# Patient Record
Sex: Female | Born: 1945 | Race: Black or African American | Hispanic: No | Marital: Married | Smoking: Never smoker
Health system: Southern US, Community
[De-identification: ages and names within clinical notes are randomized; demographics above are authoritative.]

---

## 1999-01-20 ENCOUNTER — Other Ambulatory Visit: Admission: RE | Admit: 1999-01-20 | Discharge: 1999-01-20 | Payer: Self-pay | Admitting: *Deleted

## 2000-08-17 ENCOUNTER — Encounter: Payer: Self-pay | Admitting: Family Medicine

## 2000-08-17 ENCOUNTER — Ambulatory Visit (HOSPITAL_COMMUNITY): Admission: RE | Admit: 2000-08-17 | Discharge: 2000-08-17 | Payer: Self-pay | Admitting: Family Medicine

## 2001-05-05 ENCOUNTER — Ambulatory Visit (HOSPITAL_COMMUNITY): Admission: RE | Admit: 2001-05-05 | Discharge: 2001-05-05 | Payer: Self-pay | Admitting: Family Medicine

## 2001-05-05 ENCOUNTER — Encounter: Payer: Self-pay | Admitting: Family Medicine

## 2002-06-01 ENCOUNTER — Other Ambulatory Visit: Admission: RE | Admit: 2002-06-01 | Discharge: 2002-06-01 | Payer: Self-pay | Admitting: Family Medicine

## 2002-06-02 ENCOUNTER — Encounter: Payer: Self-pay | Admitting: Family Medicine

## 2002-06-02 ENCOUNTER — Ambulatory Visit (HOSPITAL_COMMUNITY): Admission: RE | Admit: 2002-06-02 | Discharge: 2002-06-02 | Payer: Self-pay | Admitting: Family Medicine

## 2002-08-23 ENCOUNTER — Inpatient Hospital Stay (HOSPITAL_COMMUNITY): Admission: AD | Admit: 2002-08-23 | Discharge: 2002-08-23 | Payer: Self-pay | Admitting: Obstetrics and Gynecology

## 2002-08-31 ENCOUNTER — Encounter: Payer: Self-pay | Admitting: Emergency Medicine

## 2002-08-31 ENCOUNTER — Emergency Department (HOSPITAL_COMMUNITY): Admission: EM | Admit: 2002-08-31 | Discharge: 2002-08-31 | Payer: Self-pay | Admitting: Emergency Medicine

## 2003-02-18 ENCOUNTER — Encounter: Payer: Self-pay | Admitting: Emergency Medicine

## 2003-02-18 ENCOUNTER — Emergency Department (HOSPITAL_COMMUNITY): Admission: EM | Admit: 2003-02-18 | Discharge: 2003-02-18 | Payer: Self-pay | Admitting: Emergency Medicine

## 2003-03-01 ENCOUNTER — Ambulatory Visit (HOSPITAL_BASED_OUTPATIENT_CLINIC_OR_DEPARTMENT_OTHER): Admission: RE | Admit: 2003-03-01 | Discharge: 2003-03-01 | Payer: Self-pay | Admitting: Urology

## 2003-06-06 ENCOUNTER — Encounter: Payer: Self-pay | Admitting: Family Medicine

## 2003-06-06 ENCOUNTER — Ambulatory Visit (HOSPITAL_COMMUNITY): Admission: RE | Admit: 2003-06-06 | Discharge: 2003-06-06 | Payer: Self-pay | Admitting: Family Medicine

## 2004-06-30 ENCOUNTER — Ambulatory Visit (HOSPITAL_COMMUNITY): Admission: RE | Admit: 2004-06-30 | Discharge: 2004-06-30 | Payer: Self-pay | Admitting: Family Medicine

## 2005-08-20 ENCOUNTER — Ambulatory Visit (HOSPITAL_COMMUNITY): Admission: RE | Admit: 2005-08-20 | Discharge: 2005-08-20 | Payer: Self-pay | Admitting: Internal Medicine

## 2005-08-21 ENCOUNTER — Encounter (INDEPENDENT_AMBULATORY_CARE_PROVIDER_SITE_OTHER): Payer: Self-pay | Admitting: Specialist

## 2005-08-21 ENCOUNTER — Ambulatory Visit (HOSPITAL_COMMUNITY): Admission: RE | Admit: 2005-08-21 | Discharge: 2005-08-21 | Payer: Self-pay | Admitting: *Deleted

## 2005-12-11 ENCOUNTER — Ambulatory Visit (HOSPITAL_COMMUNITY): Admission: RE | Admit: 2005-12-11 | Discharge: 2005-12-11 | Payer: Self-pay | Admitting: *Deleted

## 2006-09-02 ENCOUNTER — Ambulatory Visit (HOSPITAL_COMMUNITY): Admission: RE | Admit: 2006-09-02 | Discharge: 2006-09-02 | Payer: Self-pay | Admitting: Internal Medicine

## 2007-06-30 ENCOUNTER — Ambulatory Visit (HOSPITAL_COMMUNITY): Admission: RE | Admit: 2007-06-30 | Discharge: 2007-06-30 | Payer: Self-pay | Admitting: *Deleted

## 2007-07-07 ENCOUNTER — Encounter: Admission: RE | Admit: 2007-07-07 | Discharge: 2007-07-12 | Payer: Self-pay | Admitting: Otolaryngology

## 2007-10-13 ENCOUNTER — Ambulatory Visit (HOSPITAL_COMMUNITY): Admission: RE | Admit: 2007-10-13 | Discharge: 2007-10-13 | Payer: Self-pay | Admitting: Internal Medicine

## 2008-12-06 ENCOUNTER — Encounter: Admission: RE | Admit: 2008-12-06 | Discharge: 2008-12-06 | Payer: Self-pay | Admitting: Internal Medicine

## 2010-01-16 ENCOUNTER — Encounter: Admission: RE | Admit: 2010-01-16 | Discharge: 2010-01-16 | Payer: Self-pay | Admitting: Internal Medicine

## 2010-10-25 ENCOUNTER — Encounter: Payer: Self-pay | Admitting: Internal Medicine

## 2010-10-26 ENCOUNTER — Encounter: Payer: Self-pay | Admitting: Internal Medicine

## 2011-02-03 ENCOUNTER — Other Ambulatory Visit: Payer: Self-pay | Admitting: Internal Medicine

## 2011-02-03 DIAGNOSIS — Z1231 Encounter for screening mammogram for malignant neoplasm of breast: Secondary | ICD-10-CM

## 2011-02-17 NOTE — Op Note (Signed)
NAME:  Melody Juarez, Melody Juarez              ACCOUNT NO.:  [id number]   MEDICAL RECORD NO.:  [id number]          PATIENT TYPE:  AMB   LOCATION:  ENDO                         FACILITY:  [facility]   PHYSICIAN:  Georgiana Spinner, M.D.    DATE OF BIRTH:  1946/03/25   DATE OF PROCEDURE:  06/30/2007  DATE OF DISCHARGE:                               OPERATIVE REPORT   PROCEDURE:  Colonoscopy.   INDICATIONS:  Previous colon polyps removed by me.   ANESTHESIA:  Fentanyl 100 mcg, Versed 10 mg.   PROCEDURE:  With the patient mildly sedated in the left lateral  decubitus position the Pentax videoscopic colonoscope was inserted the  rectum, passed under direct vision with pressure applied to reach the  cecum.  Cecum was identified by ileocecal valve and base of cecum.  We  searched the cecum for any remnants of previous polyps that we had  removed from the cecum 2 years ago but from this point, none were seen  and no residual polypoid tissue.  From this point the colonoscope was  then slowly withdrawn taking circumferential views of colonic mucosa  stopping then only in the rectum which appeared normal on direct and  showed hemorrhoids on retroflexed view.  The endoscope was straightened  and withdrawn.  The patient's vital signs, pulse oximeter remained  stable.  The patient tolerated procedure well without apparent  complications.   FINDINGS:  Internal hemorrhoids otherwise a negative colonoscopic  examination to cecum.   PLAN:  Repeat examination in five years.           ______________________________  Georgiana Spinner, M.D.     GMO/MEDQ  D:  06/30/2007  T:  06/30/2007  Job:  [id number]   cc:   Fleet Contras, M.D.  Fax: [phone]

## 2011-02-25 ENCOUNTER — Ambulatory Visit
Admission: RE | Admit: 2011-02-25 | Discharge: 2011-02-25 | Disposition: A | Discharge status: Home / Self Care | Payer: 59 | Source: Ambulatory Visit | Attending: Internal Medicine | Admitting: Internal Medicine

## 2011-02-25 DIAGNOSIS — Z1231 Encounter for screening mammogram for malignant neoplasm of breast: Secondary | ICD-10-CM

## 2011-12-04 DIAGNOSIS — J385 Laryngeal spasm: Secondary | ICD-10-CM | POA: Insufficient documentation

## 2012-04-11 ENCOUNTER — Other Ambulatory Visit: Payer: Self-pay | Admitting: Internal Medicine

## 2012-04-11 DIAGNOSIS — Z1231 Encounter for screening mammogram for malignant neoplasm of breast: Secondary | ICD-10-CM

## 2012-04-26 ENCOUNTER — Ambulatory Visit: Payer: 59

## 2012-05-10 ENCOUNTER — Ambulatory Visit
Admission: RE | Admit: 2012-05-10 | Discharge: 2012-05-10 | Disposition: A | Discharge status: Home / Self Care | Payer: 59 | Source: Ambulatory Visit | Attending: Internal Medicine | Admitting: Internal Medicine

## 2012-05-10 DIAGNOSIS — Z1231 Encounter for screening mammogram for malignant neoplasm of breast: Secondary | ICD-10-CM

## 2013-04-19 ENCOUNTER — Other Ambulatory Visit: Payer: Self-pay

## 2013-04-19 DIAGNOSIS — Z1231 Encounter for screening mammogram for malignant neoplasm of breast: Secondary | ICD-10-CM

## 2013-05-17 ENCOUNTER — Ambulatory Visit
Admission: RE | Admit: 2013-05-17 | Discharge: 2013-05-17 | Disposition: A | Discharge status: Home / Self Care | Payer: 59 | Source: Ambulatory Visit

## 2013-05-17 DIAGNOSIS — Z1231 Encounter for screening mammogram for malignant neoplasm of breast: Secondary | ICD-10-CM

## 2014-04-16 ENCOUNTER — Other Ambulatory Visit: Payer: Self-pay

## 2014-04-16 DIAGNOSIS — Z1231 Encounter for screening mammogram for malignant neoplasm of breast: Secondary | ICD-10-CM

## 2014-05-30 ENCOUNTER — Ambulatory Visit
Admission: RE | Admit: 2014-05-30 | Discharge: 2014-05-30 | Disposition: A | Discharge status: Home / Self Care | Payer: 59 | Source: Ambulatory Visit

## 2014-05-30 DIAGNOSIS — Z1231 Encounter for screening mammogram for malignant neoplasm of breast: Secondary | ICD-10-CM

## 2014-12-18 ENCOUNTER — Emergency Department (HOSPITAL_COMMUNITY)
Admission: EM | Admit: 2014-12-18 | Discharge: 2014-12-19 | Disposition: A | Discharge status: Home / Self Care | Payer: 59 | Attending: Emergency Medicine | Admitting: Emergency Medicine

## 2014-12-18 ENCOUNTER — Emergency Department (HOSPITAL_COMMUNITY): Payer: 59

## 2014-12-18 ENCOUNTER — Encounter (HOSPITAL_COMMUNITY): Payer: Self-pay

## 2014-12-18 DIAGNOSIS — R42 Dizziness and giddiness: Secondary | ICD-10-CM

## 2014-12-18 LAB — I-STAT CHEM 8, ED
BUN: 20 mg/dL (ref 6–23)
CALCIUM ION: 1.11 mmol/L — AB (ref 1.13–1.30)
CREATININE: 1 mg/dL (ref 0.50–1.10)
Chloride: 106 mmol/L (ref 96–112)
Glucose, Bld: 90 mg/dL (ref 70–99)
HCT: 42 % (ref 36.0–46.0)
Hemoglobin: 14.3 g/dL (ref 12.0–15.0)
Potassium: 3.9 mmol/L (ref 3.5–5.1)
Sodium: 143 mmol/L (ref 135–145)
TCO2: 23 mmol/L (ref 0–100)

## 2014-12-18 LAB — URINALYSIS, ROUTINE W REFLEX MICROSCOPIC
Bilirubin Urine: NEGATIVE
Glucose, UA: NEGATIVE mg/dL
Hgb urine dipstick: NEGATIVE
KETONES UR: NEGATIVE mg/dL
NITRITE: NEGATIVE
Protein, ur: NEGATIVE mg/dL
Specific Gravity, Urine: 1.031 — ABNORMAL HIGH (ref 1.005–1.030)
UROBILINOGEN UA: 0.2 mg/dL (ref 0.0–1.0)
pH: 7.5 (ref 5.0–8.0)

## 2014-12-18 LAB — CBC
HCT: 39.9 % (ref 36.0–46.0)
Hemoglobin: 12.5 g/dL (ref 12.0–15.0)
MCH: 30.9 pg (ref 26.0–34.0)
MCHC: 31.3 g/dL (ref 30.0–36.0)
MCV: 98.5 fL (ref 78.0–100.0)
Platelets: 298 10*3/uL (ref 150–400)
RBC: 4.05 MIL/uL (ref 3.87–5.11)
RDW: 12.1 % (ref 11.5–15.5)
WBC: 5.1 10*3/uL (ref 4.0–10.5)

## 2014-12-18 LAB — PROTIME-INR
INR: 1.07 (ref 0.00–1.49)
Prothrombin Time: 14.1 seconds (ref 11.6–15.2)

## 2014-12-18 LAB — DIFFERENTIAL
Basophils Absolute: 0 10*3/uL (ref 0.0–0.1)
Basophils Relative: 0 % (ref 0–1)
EOS PCT: 1 % (ref 0–5)
Eosinophils Absolute: 0.1 10*3/uL (ref 0.0–0.7)
Lymphocytes Relative: 48 % — ABNORMAL HIGH (ref 12–46)
Lymphs Abs: 2.5 10*3/uL (ref 0.7–4.0)
MONO ABS: 0.4 10*3/uL (ref 0.1–1.0)
MONOS PCT: 8 % (ref 3–12)
NEUTROS ABS: 2.2 10*3/uL (ref 1.7–7.7)
Neutrophils Relative %: 43 % (ref 43–77)

## 2014-12-18 LAB — I-STAT TROPONIN, ED: TROPONIN I, POC: 0 ng/mL (ref 0.00–0.08)

## 2014-12-18 LAB — RAPID URINE DRUG SCREEN, HOSP PERFORMED
AMPHETAMINES: NOT DETECTED
BENZODIAZEPINES: POSITIVE — AB
Barbiturates: NOT DETECTED
Cocaine: NOT DETECTED
OPIATES: NOT DETECTED
TETRAHYDROCANNABINOL: NOT DETECTED

## 2014-12-18 LAB — CBG MONITORING, ED: Glucose-Capillary: 70 mg/dL (ref 70–99)

## 2014-12-18 LAB — COMPREHENSIVE METABOLIC PANEL
ALT: 21 U/L (ref 0–35)
ANION GAP: 7 (ref 5–15)
AST: 25 U/L (ref 0–37)
Albumin: 4.2 g/dL (ref 3.5–5.2)
Alkaline Phosphatase: 42 U/L (ref 39–117)
BILIRUBIN TOTAL: 0.7 mg/dL (ref 0.3–1.2)
BUN: 17 mg/dL (ref 6–23)
CHLORIDE: 105 mmol/L (ref 96–112)
CO2: 29 mmol/L (ref 19–32)
CREATININE: 1 mg/dL (ref 0.50–1.10)
Calcium: 9.1 mg/dL (ref 8.4–10.5)
GFR calc non Af Amer: 57 mL/min — ABNORMAL LOW (ref >90)
GFR, EST AFRICAN AMERICAN: 66 mL/min — AB (ref >90)
GLUCOSE: 96 mg/dL (ref 70–99)
Potassium: 3.9 mmol/L (ref 3.5–5.1)
Sodium: 141 mmol/L (ref 135–145)
Total Protein: 7.1 g/dL (ref 6.0–8.3)

## 2014-12-18 LAB — ETHANOL

## 2014-12-18 LAB — URINE MICROSCOPIC-ADD ON

## 2014-12-18 LAB — APTT: aPTT: 29 seconds (ref 24–37)

## 2014-12-18 MED ORDER — MECLIZINE HCL 50 MG PO TABS: 25.0000 mg | ORAL_TABLET | Freq: Three times a day (TID) | ORAL | Status: AC | PRN

## 2014-12-18 MED ORDER — IOHEXOL 350 MG/ML SOLN
80.0000 mL | Freq: Once | INTRAVENOUS | Status: AC | PRN
  Administered 2014-12-18: 80 mL via INTRAVENOUS

## 2014-12-18 MED ORDER — MECLIZINE HCL 25 MG PO TABS
25.0000 mg | ORAL_TABLET | Freq: Once | ORAL | Status: AC
  Administered 2014-12-18: 25 mg via ORAL
  Filled 2014-12-18: qty 1

## 2014-12-18 MED ORDER — LABETALOL HCL 5 MG/ML IV SOLN
10.0000 mg | Freq: Once | INTRAVENOUS | Status: DC
  Filled 2014-12-18: qty 4

## 2014-12-18 NOTE — Discharge Instructions (Signed)

## 2014-12-18 NOTE — ED Notes (Signed)
Dr Thad Rangereynolds back in to see the patient.

## 2014-12-18 NOTE — ED Notes (Signed)
Pt states that she began having dizziness at 630pm today. Pt reports some nausea. No other neuro deficits noted.

## 2014-12-18 NOTE — Consult Note (Signed)
Referring Physician: Littie Deeds    Chief Complaint: Dizziness  HPI: Melody Juarez is an 69 y.o. female who reports that she went to work today and was fine.  Her husband picked her up and took her to get her hair done.  When she was about to get out of the car she began to feel "funny" and then noted that she was dizzy.  When she got out of the car she was unable to walk without holding on.  She sat down again for a time but when she got up again she still was unable to walk normally.  When sitting she had dizziness as well that was worse if she turned her head.  She also reports some ear "tightness".  She has had no recent illness.  Symptoms are slowly improving.    Date last known well: Date: 12/18/2014 Time last known well: Time: 18:30 tPA Given: No: Minimal symptoms that are improving.  Past medical history: Anxiety, spasmodic dysphonia  No past surgical history  Family history: Both parents had hypertension.  They are now deceased.  Mother died of pneumonia.  Father died of appendicitis.    Social History:  No history of tobacco, alcohol or illicit drug abuse .  Allergies:  Allergies  Allergen Reactions  . Ibuprofen Shortness Of Breath    Medications: I have reviewed the patient's current medications. Prior to Admission:  Xanax, ASA    ROS: History obtained from the patient  General ROS: negative for - chills, fatigue, fever, night sweats, weight gain or weight loss Psychological ROS: negative for - behavioral disorder, hallucinations, memory difficulties, mood swings or suicidal ideation Ophthalmic ROS: negative for - blurry vision, double vision, eye pain or loss of vision ENT ROS: as noted in HPI Allergy and Immunology ROS: negative for - hives or itchy/watery eyes Hematological and Lymphatic ROS: negative for - bleeding problems, bruising or swollen lymph nodes Endocrine ROS: negative for - galactorrhea, hair pattern changes, polydipsia/polyuria or temperature  intolerance Respiratory ROS: negative for - cough, hemoptysis, shortness of breath or wheezing Cardiovascular ROS: negative for - chest pain, dyspnea on exertion, edema or irregular heartbeat Gastrointestinal ROS: negative for - abdominal pain, diarrhea, hematemesis, nausea/vomiting or stool incontinence Genito-Urinary ROS: negative for - dysuria, hematuria, incontinence or urinary frequency/urgency Musculoskeletal ROS: negative for - joint swelling or muscular weakness Neurological ROS: as noted in HPI Dermatological ROS: negative for rash and skin lesion changes  Physical Examination: Blood pressure 182/83, pulse 60, temperature 97.6 F (36.4 C), temperature source Oral, resp. rate 16, height  (1.651 m), weight 88.451 kg (195 lb), SpO2 96 %.  HEENT-  Normocephalic, no lesions, without obvious abnormality.  Normal external eye and conjunctiva.  Normal TM's bilaterally.  Normal auditory canals and external ears. Normal external nose, mucus membranes and septum.  Normal pharynx. Cardiovascular- S1, S2 normal, pulses palpable throughout   Lungs- chest clear, no wheezing, rales, normal symmetric air entry Abdomen- soft, non-tender; bowel sounds normal; no masses,  no organomegaly Extremities- no edema Lymph-no adenopathy palpable Musculoskeletal-no joint tenderness, deformity or swelling Skin-warm and dry, no hyperpigmentation, vitiligo, or suspicious lesions  Neurological Examination Mental Status: Alert, oriented, thought content appropriate.  Speech fluent without evidence of aphasia.  Able to follow 3 step commands without difficulty. Cranial Nerves: II: Discs flat bilaterally; Visual fields grossly normal, pupils equal, round, reactive to light and accommodation III,IV, VI: ptosis not present, extra-ocular motions intact bilaterally V,VII: mild decrease in right NLF, facial light touch sensation normal  bilaterally VIII: hearing normal bilaterally IX,X: gag reflex present XI:  bilateral shoulder shrug XII: midline tongue extension Motor: Right : Upper extremity   5/5    Left:     Upper extremity   5/5  Lower extremity   5/5     Lower extremity   5/5 Tone and bulk:normal tone throughout; no atrophy noted Sensory: Pinprick and light touch intact throughout, bilaterally Deep Tendon Reflexes: 2+ and symmetric throughout Plantars: Right: downgoing   Left: downgoing Cerebellar: normal finger-to-nose and normal heel-to-shin testing bilaterally Gait: not tested due to dizziness    Laboratory Studies:  Basic Metabolic Panel:  Recent Labs Lab 12/18/14 1923 12/18/14 1935  NA 143 141  K 3.9 3.9  CL 106 105  CO2  --  29  GLUCOSE 90 96  BUN 20 17  CREATININE 1.00 1.00  CALCIUM  --  9.1    Liver Function Tests:  Recent Labs Lab 12/18/14 1935  AST 25  ALT 21  ALKPHOS 42  BILITOT 0.7  PROT 7.1  ALBUMIN 4.2   No results for input(s): LIPASE, AMYLASE in the last 168 hours. No results for input(s): AMMONIA in the last 168 hours.  CBC:  Recent Labs Lab 12/18/14 1923 12/18/14 1935  WBC  --  5.1  NEUTROABS  --  2.2  HGB 14.3 12.5  HCT 42.0 39.9  MCV  --  98.5  PLT  --  298    Cardiac Enzymes: No results for input(s): CKTOTAL, CKMB, CKMBINDEX, TROPONINI in the last 168 hours.  BNP: Invalid input(s): POCBNP  CBG:  Recent Labs Lab 12/18/14 1932  GLUCAP 70    Microbiology: No results found for this or any previous visit.  Coagulation Studies:  Recent Labs  12/18/14 1935  LABPROT 14.1  INR 1.07    Urinalysis:   Recent Labs Lab 12/18/14 2232  COLORURINE YELLOW  LABSPEC 1.031*  PHURINE 7.5  GLUCOSEU NEGATIVE  HGBUR NEGATIVE  BILIRUBINUR NEGATIVE  KETONESUR NEGATIVE  PROTEINUR NEGATIVE  UROBILINOGEN 0.2  NITRITE NEGATIVE  LEUKOCYTESUR SMALL*    Lipid Panel: No results found for: CHOL, TRIG, HDL, CHOLHDL, VLDL, LDLCALC  HgbA1C: No results found for: HGBA1C  Urine Drug Screen:  No results found for: LABOPIA,  COCAINSCRNUR, LABBENZ, AMPHETMU, THCU, LABBARB  Alcohol Level:   Recent Labs Lab 12/18/14 1934  ETH <5    Other results: EKG: normal sinus rhythm at 80 bpm.  Imaging: Ct Angio Head W/cm &/or Wo Cm  12/18/2014   CLINICAL DATA:  70 year old female with vertigo. Episode of dizziness today. Initial encounter.  EXAM: CT ANGIOGRAPHY HEAD AND NECK  TECHNIQUE: Multidetector CT imaging of the head and neck was performed using the standard protocol during bolus administration of intravenous contrast. Multiplanar CT image reconstructions and MIPs were obtained to evaluate the vascular anatomy. Carotid stenosis measurements (when applicable) are obtained utilizing NASCET criteria, using the distal internal carotid diameter as the denominator.  CONTRAST:  80mL OMNIPAQUE IOHEXOL 350 MG/ML SOLN  COMPARISON:  Head CT without contrast 1919 hours today.  FINDINGS: CTA NECK  Skeleton: Degenerative changes in the spine. Probable benign sclerosis/ bone island in the left C3 posterior elements. Hyperostosis of the calvarium. No acute osseous abnormality identified. Negative paranasal sinuses and mastoids.  Other neck: Negative lung apices. No superior mediastinal lymphadenopathy.  Negative thyroid, larynx, pharynx, parapharyngeal spaces, sublingual space, retropharyngeal space (right greater than left retropharyngeal carotid arteries), parotid glands and submandibular glands. Visualized orbit soft tissues are within normal limits. No cervical  lymphadenopathy.  Aortic arch: 3 vessel arch configuration. Minimal arch atherosclerosis. No great vessel origin stenosis.  Right carotid system: Negative except for tortuosity, and retropharyngeal course of the right ICA.  Left carotid system: Negative except for tortuosity.  Vertebral arteries:  No proximal subclavian artery stenosis. No vertebral artery origin stenosis. Proximal vertebral artery tortuosity greater on the left. Codominant vertebral arteries. Intermittent tortuosity  more so on the left, but otherwise negative vertebral arteries to the skullbase.  CTA HEAD  Posterior circulation: Codominant distal vertebral arteries. Normal PICA origins. Normal vertebrobasilar junction. No basilar stenosis. SCA and left PCA origins are within normal limits. Fetal type right PCA origin. Left posterior communicating artery is diminutive or absent. Bilateral PCA branches are within normal limits.  Anterior circulation: Minimal calcified plaque of the ICA siphons. No siphon stenosis. Normal ophthalmic artery origins. Normal posterior communicating artery/anterior choroidal artery origins (infundibulum on the left). Normal carotid termini, MCA and ACA origins. Mildly tortuous proximal ACA is. Normal anterior communicating artery and bilateral ACA branches. Left MCA branches within normal limits. Right MCA branches within normal limits.  Venous sinuses: Within normal limits.  Anatomic variants: Fetal type right PCA origin.  Delayed phase: Negative; no abnormal enhancement identified. Stable gray-white matter differentiation.  IMPRESSION: 1. Negative CTA head and neck aside from some vessel tortuosity. 2. Stable, negative CT appearance of the brain.   Electronically Signed   By: Odessa Fleming M.D.   On: 12/18/2014 22:01   Ct Head Wo Contrast  12/18/2014   CLINICAL DATA:  Acute onset of dizziness. Code stroke. Initial encounter.  EXAM: CT HEAD WITHOUT CONTRAST  TECHNIQUE: Contiguous axial images were obtained from the base of the skull through the vertex without intravenous contrast.  COMPARISON:  None.  FINDINGS: There is no evidence of acute infarction, mass lesion, or intra- or extra-axial hemorrhage on CT.  The posterior fossa, including the cerebellum, brainstem and fourth ventricle, is within normal limits. The third and lateral ventricles, and basal ganglia are unremarkable in appearance. The cerebral hemispheres are symmetric in appearance, with normal gray-white differentiation. No mass effect or  midline shift is seen.  There is no evidence of fracture; visualized osseous structures are unremarkable in appearance. The orbits are within normal limits. The paranasal sinuses and mastoid air cells are well-aerated. No significant soft tissue abnormalities are seen.  IMPRESSION: Unremarkable noncontrast CT of the head.  These results were called by telephone at the time of interpretation on 12/18/2014 at 7:27 pm to Dr. Littie Deeds, who verbally acknowledged these results.   Electronically Signed   By: Roanna Raider M.D.   On: 12/18/2014 19:30   Ct Angio Neck W/cm &/or Wo/cm  12/18/2014   CLINICAL DATA:  69 year old female with vertigo. Episode of dizziness today. Initial encounter.  EXAM: CT ANGIOGRAPHY HEAD AND NECK  TECHNIQUE: Multidetector CT imaging of the head and neck was performed using the standard protocol during bolus administration of intravenous contrast. Multiplanar CT image reconstructions and MIPs were obtained to evaluate the vascular anatomy. Carotid stenosis measurements (when applicable) are obtained utilizing NASCET criteria, using the distal internal carotid diameter as the denominator.  CONTRAST:  80mL OMNIPAQUE IOHEXOL 350 MG/ML SOLN  COMPARISON:  Head CT without contrast 1919 hours today.  FINDINGS: CTA NECK  Skeleton: Degenerative changes in the spine. Probable benign sclerosis/ bone island in the left C3 posterior elements. Hyperostosis of the calvarium. No acute osseous abnormality identified. Negative paranasal sinuses and mastoids.  Other neck: Negative lung apices. No superior  mediastinal lymphadenopathy.  Negative thyroid, larynx, pharynx, parapharyngeal spaces, sublingual space, retropharyngeal space (right greater than left retropharyngeal carotid arteries), parotid glands and submandibular glands. Visualized orbit soft tissues are within normal limits. No cervical lymphadenopathy.  Aortic arch: 3 vessel arch configuration. Minimal arch atherosclerosis. No great vessel origin  stenosis.  Right carotid system: Negative except for tortuosity, and retropharyngeal course of the right ICA.  Left carotid system: Negative except for tortuosity.  Vertebral arteries:  No proximal subclavian artery stenosis. No vertebral artery origin stenosis. Proximal vertebral artery tortuosity greater on the left. Codominant vertebral arteries. Intermittent tortuosity more so on the left, but otherwise negative vertebral arteries to the skullbase.  CTA HEAD  Posterior circulation: Codominant distal vertebral arteries. Normal PICA origins. Normal vertebrobasilar junction. No basilar stenosis. SCA and left PCA origins are within normal limits. Fetal type right PCA origin. Left posterior communicating artery is diminutive or absent. Bilateral PCA branches are within normal limits.  Anterior circulation: Minimal calcified plaque of the ICA siphons. No siphon stenosis. Normal ophthalmic artery origins. Normal posterior communicating artery/anterior choroidal artery origins (infundibulum on the left). Normal carotid termini, MCA and ACA origins. Mildly tortuous proximal ACA is. Normal anterior communicating artery and bilateral ACA branches. Left MCA branches within normal limits. Right MCA branches within normal limits.  Venous sinuses: Within normal limits.  Anatomic variants: Fetal type right PCA origin.  Delayed phase: Negative; no abnormal enhancement identified. Stable gray-white matter differentiation.  IMPRESSION: 1. Negative CTA head and neck aside from some vessel tortuosity. 2. Stable, negative CT appearance of the brain.   Electronically Signed   By: Odessa FlemingH  Hall M.D.   On: 12/18/2014 22:01    Assessment: 69 y.o. female with acute onset of dizziness.  No further neurological complaints and neurological examination is non-focal.  Head CT personally reviewed and shows no acute changes.  Symptoms appear peripheral but further work up recommended.  Stroke Risk Factors - none  Plan: 1. CTA of head and neck.   Will follow up results.   2. BP control 3. Frequent neuro checks 4. Continue ASA 5. Meclizine prn   Thana FarrLeslie Eryka Dolinger, MD [facility] [phone] 12/18/2014, 11:01 PM  Addendum: CTA of head and neck reviewed.  No significant stenosis noted.  BP improving without intervention.  Case discussed with Dr. Littie DeedsGentry.  Recommendations: 1.  Outpatient appointment with ENT  2.  Meclizine prn  Thana FarrLeslie Trevonne Nyland, MD [facility] [phone]

## 2014-12-19 NOTE — ED Provider Notes (Signed)
CSN: [id number]     Arrival date & time 12/18/14  1844 History   First MD Initiated Contact with Patient 12/18/14 1910     No chief complaint on file.   An emergency department physician performed an initial assessment on this suspected stroke patient at 7. (Consider location/radiation/quality/duration/timing/severity/associated sxs/prior Treatment) Patient is a 69 y.o. female presenting with dizziness. The history is provided by the patient.  Dizziness Quality:  Room spinning and head spinning Severity:  Severe Onset quality:  Sudden Duration:  1 hour Timing:  Intermittent Progression:  Waxing and waning Chronicity:  New Context: head movement and standing up   Relieved by:  None tried Worsened by:  Movement Ineffective treatments:  None tried Associated symptoms: no chest pain, no diarrhea, no headaches, no nausea, no shortness of breath, no vomiting and no weakness   Risk factors: no hx of stroke     History reviewed. No pertinent past medical history. No past surgical history on file. No family history on file. History  Substance Use Topics  . Smoking status: Not on file  . Smokeless tobacco: Not on file  . Alcohol Use: Not on file   OB History    No data available     Review of Systems  Constitutional: Negative for fever and chills.  HENT: Negative for nosebleeds.   Eyes: Negative for visual disturbance.  Respiratory: Negative for cough and shortness of breath.   Cardiovascular: Negative for chest pain.  Gastrointestinal: Negative for nausea, vomiting, abdominal pain, diarrhea and constipation.  Genitourinary: Negative for dysuria.  Skin: Negative for rash.  Allergic/Immunologic: Negative.   Neurological: Positive for dizziness. Negative for weakness and headaches.  All other systems reviewed and are negative.     Allergies  Ibuprofen  Home Medications   Prior to Admission medications   Medication Sig Start Date End Date Taking? Authorizing Provider   ALPRAZolam Prudy Feeler) 0.5 MG tablet Take 0.5 mg by mouth at bedtime.  11/30/14  Yes Historical Provider, MD  meclizine (ANTIVERT) 50 MG tablet Take 0.5 tablets (25 mg total) by mouth 3 (three) times daily as needed. 12/18/14   Silas Flood, MD   BP 158/82 mmHg  Pulse 63  Temp(Src) 97.6 F (36.4 C) (Oral)  Resp 18  Ht  (1.651 m)  Wt 195 lb (88.451 kg)  BMI 32.45 kg/m2  SpO2 97% Physical Exam  Constitutional: She is oriented to person, place, and time. No distress.  HENT:  Head: Normocephalic and atraumatic.  Eyes: EOM are normal. Pupils are equal, round, and reactive to light.  Neck: Normal range of motion. Neck supple.  Cardiovascular: Normal rate and intact distal pulses.   Pulmonary/Chest: No respiratory distress.  Abdominal: Soft. There is no tenderness.  Musculoskeletal: Normal range of motion.  Neurological: She is alert and oriented to person, place, and time.  Skin: No rash noted. She is not diaphoretic.  Psychiatric: She has a normal mood and affect.   Cranial Nerves  II:  Visual fields Intact, pupillary equal round and reactive to light III, IV, VI: Full eye movement without nystagmus  V Facial Sensation: Normal. No weakness of masticatory muscles  VII: No facial weakness or asymmetry  VIII Auditory Acuity: Grossly normal  IX/X: The uvula is midline; the palate elevates symmetrically  XI: Normal sternocleidomastoid and trapezius strength  XII: The tongue is midline. No atrophy or fasciculations.  Motor System: Muscle Strength: 5/5 and symmetric in the upper and lower extremities. No pronation or drift.  Muscle Tone:  Tone and muscle bulk are normal in the upper and lower extremities.  Reflexes: DTRs: 2+ and symmetrical in all four extremities.  Coordination: Intact finger-to-nose, heel-to-shin, and rapid alternating movements. No tremor.  Sensation: Intact to light touch.   Gait: Routine and tandem gait are normal      ED Course  Procedures (including critical  care time) Labs Review Labs Reviewed  DIFFERENTIAL - Abnormal; Notable for the following:    Lymphocytes Relative 48 (*)    All other components within normal limits  COMPREHENSIVE METABOLIC PANEL - Abnormal; Notable for the following:    GFR calc non Af Amer 57 (*)    GFR calc Af Amer 66 (*)    All other components within normal limits  URINE RAPID DRUG SCREEN (HOSP PERFORMED) - Abnormal; Notable for the following:    Benzodiazepines POSITIVE (*)    All other components within normal limits  URINALYSIS, ROUTINE W REFLEX MICROSCOPIC - Abnormal; Notable for the following:    Specific Gravity, Urine 1.031 (*)    Leukocytes, UA SMALL (*)    All other components within normal limits  URINE MICROSCOPIC-ADD ON - Abnormal; Notable for the following:    Bacteria, UA FEW (*)    All other components within normal limits  I-STAT CHEM 8, ED - Abnormal; Notable for the following:    Calcium, Ion 1.11 (*)    All other components within normal limits  ETHANOL  PROTIME-INR  APTT  CBC  I-STAT TROPOININ, ED  I-STAT TROPOININ, ED  CBG MONITORING, ED    Imaging Review Ct Angio Head W/cm &/or Wo Cm  12/18/2014   CLINICAL DATA:  69 year old female with vertigo. Episode of dizziness today. Initial encounter.  EXAM: CT ANGIOGRAPHY HEAD AND NECK  TECHNIQUE: Multidetector CT imaging of the head and neck was performed using the standard protocol during bolus administration of intravenous contrast. Multiplanar CT image reconstructions and MIPs were obtained to evaluate the vascular anatomy. Carotid stenosis measurements (when applicable) are obtained utilizing NASCET criteria, using the distal internal carotid diameter as the denominator.  CONTRAST:  80mL OMNIPAQUE IOHEXOL 350 MG/ML SOLN  COMPARISON:  Head CT without contrast 1919 hours today.  FINDINGS: CTA NECK  Skeleton: Degenerative changes in the spine. Probable benign sclerosis/ bone island in the left C3 posterior elements. Hyperostosis of the  calvarium. No acute osseous abnormality identified. Negative paranasal sinuses and mastoids.  Other neck: Negative lung apices. No superior mediastinal lymphadenopathy.  Negative thyroid, larynx, pharynx, parapharyngeal spaces, sublingual space, retropharyngeal space (right greater than left retropharyngeal carotid arteries), parotid glands and submandibular glands. Visualized orbit soft tissues are within normal limits. No cervical lymphadenopathy.  Aortic arch: 3 vessel arch configuration. Minimal arch atherosclerosis. No great vessel origin stenosis.  Right carotid system: Negative except for tortuosity, and retropharyngeal course of the right ICA.  Left carotid system: Negative except for tortuosity.  Vertebral arteries:  No proximal subclavian artery stenosis. No vertebral artery origin stenosis. Proximal vertebral artery tortuosity greater on the left. Codominant vertebral arteries. Intermittent tortuosity more so on the left, but otherwise negative vertebral arteries to the skullbase.  CTA HEAD  Posterior circulation: Codominant distal vertebral arteries. Normal PICA origins. Normal vertebrobasilar junction. No basilar stenosis. SCA and left PCA origins are within normal limits. Fetal type right PCA origin. Left posterior communicating artery is diminutive or absent. Bilateral PCA branches are within normal limits.  Anterior circulation: Minimal calcified plaque of the ICA siphons. No siphon stenosis. Normal ophthalmic artery origins. Normal posterior  communicating artery/anterior choroidal artery origins (infundibulum on the left). Normal carotid termini, MCA and ACA origins. Mildly tortuous proximal ACA is. Normal anterior communicating artery and bilateral ACA branches. Left MCA branches within normal limits. Right MCA branches within normal limits.  Venous sinuses: Within normal limits.  Anatomic variants: Fetal type right PCA origin.  Delayed phase: Negative; no abnormal enhancement identified. Stable  gray-white matter differentiation.  IMPRESSION: 1. Negative CTA head and neck aside from some vessel tortuosity. 2. Stable, negative CT appearance of the brain.   Electronically Signed   By: Odessa Fleming M.D.   On: 12/18/2014 22:01   Ct Head Wo Contrast  12/18/2014   CLINICAL DATA:  Acute onset of dizziness. Code stroke. Initial encounter.  EXAM: CT HEAD WITHOUT CONTRAST  TECHNIQUE: Contiguous axial images were obtained from the base of the skull through the vertex without intravenous contrast.  COMPARISON:  None.  FINDINGS: There is no evidence of acute infarction, mass lesion, or intra- or extra-axial hemorrhage on CT.  The posterior fossa, including the cerebellum, brainstem and fourth ventricle, is within normal limits. The third and lateral ventricles, and basal ganglia are unremarkable in appearance. The cerebral hemispheres are symmetric in appearance, with normal gray-white differentiation. No mass effect or midline shift is seen.  There is no evidence of fracture; visualized osseous structures are unremarkable in appearance. The orbits are within normal limits. The paranasal sinuses and mastoid air cells are well-aerated. No significant soft tissue abnormalities are seen.  IMPRESSION: Unremarkable noncontrast CT of the head.  These results were called by telephone at the time of interpretation on 12/18/2014 at 7:27 pm to Dr. Littie Deeds, who verbally acknowledged these results.   Electronically Signed   By: Roanna Raider M.D.   On: 12/18/2014 19:30   Ct Angio Neck W/cm &/or Wo/cm  12/18/2014   CLINICAL DATA:  69 year old female with vertigo. Episode of dizziness today. Initial encounter.  EXAM: CT ANGIOGRAPHY HEAD AND NECK  TECHNIQUE: Multidetector CT imaging of the head and neck was performed using the standard protocol during bolus administration of intravenous contrast. Multiplanar CT image reconstructions and MIPs were obtained to evaluate the vascular anatomy. Carotid stenosis measurements (when  applicable) are obtained utilizing NASCET criteria, using the distal internal carotid diameter as the denominator.  CONTRAST:  80mL OMNIPAQUE IOHEXOL 350 MG/ML SOLN  COMPARISON:  Head CT without contrast 1919 hours today.  FINDINGS: CTA NECK  Skeleton: Degenerative changes in the spine. Probable benign sclerosis/ bone island in the left C3 posterior elements. Hyperostosis of the calvarium. No acute osseous abnormality identified. Negative paranasal sinuses and mastoids.  Other neck: Negative lung apices. No superior mediastinal lymphadenopathy.  Negative thyroid, larynx, pharynx, parapharyngeal spaces, sublingual space, retropharyngeal space (right greater than left retropharyngeal carotid arteries), parotid glands and submandibular glands. Visualized orbit soft tissues are within normal limits. No cervical lymphadenopathy.  Aortic arch: 3 vessel arch configuration. Minimal arch atherosclerosis. No great vessel origin stenosis.  Right carotid system: Negative except for tortuosity, and retropharyngeal course of the right ICA.  Left carotid system: Negative except for tortuosity.  Vertebral arteries:  No proximal subclavian artery stenosis. No vertebral artery origin stenosis. Proximal vertebral artery tortuosity greater on the left. Codominant vertebral arteries. Intermittent tortuosity more so on the left, but otherwise negative vertebral arteries to the skullbase.  CTA HEAD  Posterior circulation: Codominant distal vertebral arteries. Normal PICA origins. Normal vertebrobasilar junction. No basilar stenosis. SCA and left PCA origins are within normal limits. Fetal type right PCA  origin. Left posterior communicating artery is diminutive or absent. Bilateral PCA branches are within normal limits.  Anterior circulation: Minimal calcified plaque of the ICA siphons. No siphon stenosis. Normal ophthalmic artery origins. Normal posterior communicating artery/anterior choroidal artery origins (infundibulum on the left).  Normal carotid termini, MCA and ACA origins. Mildly tortuous proximal ACA is. Normal anterior communicating artery and bilateral ACA branches. Left MCA branches within normal limits. Right MCA branches within normal limits.  Venous sinuses: Within normal limits.  Anatomic variants: Fetal type right PCA origin.  Delayed phase: Negative; no abnormal enhancement identified. Stable gray-white matter differentiation.  IMPRESSION: 1. Negative CTA head and neck aside from some vessel tortuosity. 2. Stable, negative CT appearance of the brain.   Electronically Signed   By: Odessa FlemingH  Hall M.D.   On: 12/18/2014 22:01     EKG Interpretation   Date/Time:  Tuesday December 18 2014 19:03:52 EDT Ventricular Rate:  80 PR Interval:  124 QRS Duration: 80 QT Interval:  378 QTC Calculation: 435 R Axis:   5 Text Interpretation:  Normal sinus rhythm Biatrial enlargement Left  ventricular hypertrophy with repolarization abnormality Abnormal ECG t  wave inversions now present in inferolateral leads Confirmed by Mirian MoGentry,  Matthew [phone]([id number]) on 12/18/2014 7:15:47 PM      MDM   Final diagnoses:  Dizziness    69 y/o female with no sig PMH who presents with vertiginous sx that started 30 min prior to arrival with no associated sx.  She has never had sx like this before.  No hx of DM, HTN, previous stroke.  Her sx are intermittent, worse with head movement.  No other neurologic symptoms.  No weakness, no double vision, no trouble speaking.  Exam as above, unremarkable.  Patient was called out as a code stroke in triage and went immediately to CT scanner where no bleed was seen.  Neurology was at bedside and felt this was likely to be a peripheral cause of vertigo but recommended CTA brain to further risk stratify  Have given meclizine.  CTA w/o evidence of posterior stroke.    Patient feeling better after meclizine, no dizziness at rest.  Able to ambulate w/o difficulty.  At this time, feel safe for d/c.  Have given a  short rx for meclizine.  I have discussed the results, Dx and Tx plan with the patient. They expressed understanding and agree with the plan and were told to return to ED with any worsening of condition or concern.    Disposition: Discharge  Condition: Good  Discharge Medication List as of 12/18/2014 11:20 PM    START taking these medications   Details  meclizine (ANTIVERT) 50 MG tablet Take 0.5 tablets (25 mg total) by mouth 3 (three) times daily as needed., Starting 12/18/2014, Until Discontinued, Print        [facility] [address] [id number] [id number] [phone]  If symptoms worsen   Pt seen in conjunction with Dr. Christ Kickgentry     Aundrea Higginbotham, MD 12/19/14 1344  Mirian MoMatthew Gentry, MD 12/24/14 2012

## 2014-12-26 ENCOUNTER — Encounter: Payer: Self-pay | Admitting: *Deleted

## 2014-12-26 ENCOUNTER — Encounter: Payer: Self-pay | Admitting: Neurology

## 2014-12-26 ENCOUNTER — Ambulatory Visit (INDEPENDENT_AMBULATORY_CARE_PROVIDER_SITE_OTHER): Payer: 59 | Admitting: Neurology

## 2014-12-26 VITALS — BP 157/82 | HR 71 | Ht 65.0 in | Wt 199.0 lb

## 2014-12-26 DIAGNOSIS — J383 Other diseases of vocal cords: Secondary | ICD-10-CM

## 2014-12-26 DIAGNOSIS — R490 Dysphonia: Secondary | ICD-10-CM | POA: Insufficient documentation

## 2014-12-26 DIAGNOSIS — J385 Laryngeal spasm: Secondary | ICD-10-CM

## 2014-12-26 NOTE — Progress Notes (Signed)
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

## 2015-04-04 ENCOUNTER — Encounter: Payer: Self-pay | Admitting: Neurology

## 2015-04-04 ENCOUNTER — Ambulatory Visit (INDEPENDENT_AMBULATORY_CARE_PROVIDER_SITE_OTHER): Payer: 59 | Admitting: Neurology

## 2015-04-04 VITALS — BP 169/98 | HR 59 | Ht 65.0 in | Wt 187.0 lb

## 2015-04-04 DIAGNOSIS — J385 Laryngeal spasm: Secondary | ICD-10-CM | POA: Diagnosis not present

## 2015-04-04 DIAGNOSIS — J383 Other diseases of vocal cords: Secondary | ICD-10-CM

## 2015-04-04 DIAGNOSIS — R490 Dysphonia: Secondary | ICD-10-CM

## 2015-04-04 NOTE — Progress Notes (Signed)
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

## 2015-04-29 ENCOUNTER — Other Ambulatory Visit: Payer: Self-pay | Admitting: Internal Medicine

## 2015-04-29 DIAGNOSIS — Z1231 Encounter for screening mammogram for malignant neoplasm of breast: Secondary | ICD-10-CM

## 2015-06-03 ENCOUNTER — Ambulatory Visit: Payer: Medicare Other

## 2015-07-11 ENCOUNTER — Ambulatory Visit: Payer: 59 | Admitting: Neurology

## 2015-07-19 ENCOUNTER — Ambulatory Visit
Admission: RE | Admit: 2015-07-19 | Discharge: 2015-07-19 | Disposition: A | Discharge status: Home / Self Care | Payer: Medicare Other | Source: Ambulatory Visit | Attending: Internal Medicine | Admitting: Internal Medicine

## 2015-07-19 DIAGNOSIS — Z1231 Encounter for screening mammogram for malignant neoplasm of breast: Secondary | ICD-10-CM

## 2015-08-05 ENCOUNTER — Telehealth: Payer: Self-pay | Admitting: Neurology

## 2015-08-05 NOTE — Telephone Encounter (Signed)
[facility]/Dr. Jenne PaneBates [phone] called to schedule Botox appointment.

## 2015-08-06 NOTE — Telephone Encounter (Signed)
Scheduled

## 2015-08-13 ENCOUNTER — Ambulatory Visit (INDEPENDENT_AMBULATORY_CARE_PROVIDER_SITE_OTHER): Payer: 59 | Admitting: Neurology

## 2015-08-13 ENCOUNTER — Encounter: Payer: Self-pay | Admitting: Neurology

## 2015-08-13 VITALS — BP 157/80 | HR 58 | Ht 65.0 in | Wt 187.0 lb

## 2015-08-13 DIAGNOSIS — R490 Dysphonia: Secondary | ICD-10-CM

## 2015-08-13 DIAGNOSIS — J383 Other diseases of vocal cords: Secondary | ICD-10-CM

## 2015-08-13 NOTE — Progress Notes (Signed)
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

## 2015-08-13 NOTE — Progress Notes (Signed)
**  Botox 100units x 1, Lot [id number], Exp 02/2018, NDC 3086-5784-690023-1145-01, Specialty Pharmacy**mck,rn

## 2015-10-31 ENCOUNTER — Telehealth: Payer: Self-pay | Admitting: Neurology

## 2015-10-31 NOTE — Telephone Encounter (Signed)
Elizebeth Brooking with Dr Jenne Pane [phone] called to speak with Duwayne Heck

## 2015-11-07 ENCOUNTER — Ambulatory Visit (INDEPENDENT_AMBULATORY_CARE_PROVIDER_SITE_OTHER): Payer: 59 | Admitting: Neurology

## 2015-11-07 ENCOUNTER — Encounter: Payer: Self-pay | Admitting: Neurology

## 2015-11-07 VITALS — BP 150/79 | HR 59 | Ht 66.5 in | Wt 170.0 lb

## 2015-11-07 DIAGNOSIS — J385 Laryngeal spasm: Secondary | ICD-10-CM | POA: Diagnosis not present

## 2015-11-07 NOTE — Progress Notes (Signed)
Patient is injected by ENT physician Dr. Dwight Bates for spastic dysphonia, she was injected by outside physician in the past  Under EMG guidance, 1.0 units of Botox a was injected into left, and 1.0 unit to right thyroarytenoid muscle,  There was no spontaneous activity, sharp normal morphology motor unit potential, with normal recruitment were noted at bilateral thyroarytenoid muscle with normal recruitment patterns  Patient tolerate the injection well, will return to clinic in 3-6 months for repeat injection 

## 2015-11-07 NOTE — Progress Notes (Signed)
**  Botox 100units x 1 vial, [id number], Exp 06/2018, NDC 0981-1914-78, specialty pharmacy**mck

## 2015-11-26 DIAGNOSIS — E782 Mixed hyperlipidemia: Secondary | ICD-10-CM | POA: Insufficient documentation

## 2015-11-26 DIAGNOSIS — H905 Unspecified sensorineural hearing loss: Secondary | ICD-10-CM | POA: Insufficient documentation

## 2015-11-26 DIAGNOSIS — H9319 Tinnitus, unspecified ear: Secondary | ICD-10-CM | POA: Insufficient documentation

## 2015-11-26 DIAGNOSIS — J383 Other diseases of vocal cords: Secondary | ICD-10-CM | POA: Insufficient documentation

## 2015-11-26 DIAGNOSIS — H93299 Other abnormal auditory perceptions, unspecified ear: Secondary | ICD-10-CM | POA: Insufficient documentation

## 2016-01-29 ENCOUNTER — Telehealth: Payer: Self-pay | Admitting: Neurology

## 2016-01-29 NOTE — Telephone Encounter (Signed)
[facility] [phone] called to schedule appt

## 2016-02-03 NOTE — Telephone Encounter (Signed)
Patient has been scheduled

## 2016-02-17 ENCOUNTER — Encounter: Payer: Self-pay | Admitting: Neurology

## 2016-02-17 ENCOUNTER — Ambulatory Visit (INDEPENDENT_AMBULATORY_CARE_PROVIDER_SITE_OTHER): Payer: 59 | Admitting: Neurology

## 2016-02-17 VITALS — BP 125/78 | HR 88 | Ht 66.5 in | Wt 170.0 lb

## 2016-02-17 DIAGNOSIS — J383 Other diseases of vocal cords: Secondary | ICD-10-CM

## 2016-02-17 DIAGNOSIS — R490 Dysphonia: Secondary | ICD-10-CM

## 2016-02-17 NOTE — Progress Notes (Signed)
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 Last injection by Dr. Jenne PaneBates was in Feb 2nd 2017  Under EMG guidance, 1.0 units of Botox a was injected into left, and 1.0 unit to right thyroarytenoid muscle,  There was no spontaneous activity, sharp normal morphology motor unit potential, with normal recruitment were noted at bilateral thyroarytenoid muscle with normal recruitment patterns  Patient tolerate the injection well, will return to clinic in 3-6 months for repeat injection

## 2016-02-17 NOTE — Progress Notes (Signed)
**  Botox 100 units x 1 vial, Lot [id number], Exp 08/2018, NDC [id number], specialty pharmacy.**mck,rn.

## 2016-02-20 ENCOUNTER — Other Ambulatory Visit: Payer: Self-pay | Admitting: Internal Medicine

## 2016-02-20 DIAGNOSIS — E2839 Other primary ovarian failure: Secondary | ICD-10-CM

## 2016-03-13 ENCOUNTER — Ambulatory Visit
Admission: RE | Admit: 2016-03-13 | Discharge: 2016-03-13 | Disposition: A | Discharge status: Home / Self Care | Payer: Medicare Other | Source: Ambulatory Visit | Attending: Internal Medicine | Admitting: Internal Medicine

## 2016-03-13 DIAGNOSIS — E2839 Other primary ovarian failure: Secondary | ICD-10-CM

## 2016-03-17 ENCOUNTER — Other Ambulatory Visit: Payer: 59

## 2016-05-19 ENCOUNTER — Telehealth: Payer: Self-pay | Admitting: Neurology

## 2016-05-19 NOTE — Telephone Encounter (Signed)
Pt c/a botox appt for 05/21/16. She said that Dr Jenne PaneBates [facility]NT [phone] will r/s her appt when she needs it

## 2016-05-21 ENCOUNTER — Ambulatory Visit: Payer: 59 | Admitting: Neurology

## 2016-05-26 NOTE — Telephone Encounter (Signed)
Spoke with Dr. Jenne PaneBates nurse tamara on Friday the 18th. She stated that she would call me back to schedule.

## 2016-07-06 ENCOUNTER — Telehealth: Payer: Self-pay | Admitting: Neurology

## 2016-07-06 NOTE — Telephone Encounter (Signed)
[facility]/Dr. Jenne PaneBates called to schedule BOTOX appointment. Please call to schedule.

## 2016-07-14 NOTE — Telephone Encounter (Signed)
Late Entry; Called [facility] and scheduled injection.

## 2016-07-29 ENCOUNTER — Encounter: Payer: Self-pay | Admitting: Neurology

## 2016-07-29 ENCOUNTER — Ambulatory Visit (INDEPENDENT_AMBULATORY_CARE_PROVIDER_SITE_OTHER): Payer: Medicare PPO | Admitting: Neurology

## 2016-07-29 VITALS — BP 164/93 | HR 64 | Ht 66.5 in | Wt 170.0 lb

## 2016-07-29 DIAGNOSIS — R490 Dysphonia: Secondary | ICD-10-CM

## 2016-07-29 DIAGNOSIS — J383 Other diseases of vocal cords: Secondary | ICD-10-CM

## 2016-07-29 NOTE — Progress Notes (Signed)
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 Last injection by Dr. Jenne PaneBates was on Feb 17 2016  Under EMG guidance, 2.5 units of Botox a was injected into left, and 2.5 unit to right thyroarytenoid muscle,  There was occasionally mild spontaneous activity, polyphasic morphology motor unit potential, with normal recruitment were noted at bilateral thyroarytenoid muscle with normal recruitment patterns  Patient tolerate the injection well, will return to clinic in 3-6 months for repeat injection

## 2016-07-29 NOTE — Progress Notes (Signed)
**  Botox 100 units x 1 vial, Lot [id number], Exp 12/2018, NDC [id number], specialty pharmacy.//mck,rn.**

## 2016-08-17 ENCOUNTER — Other Ambulatory Visit: Payer: Self-pay | Admitting: Internal Medicine

## 2016-08-17 DIAGNOSIS — Z1231 Encounter for screening mammogram for malignant neoplasm of breast: Secondary | ICD-10-CM

## 2016-09-17 ENCOUNTER — Ambulatory Visit
Admission: RE | Admit: 2016-09-17 | Discharge: 2016-09-17 | Disposition: A | Discharge status: Home / Self Care | Payer: Medicare PPO | Source: Ambulatory Visit | Attending: Internal Medicine | Admitting: Internal Medicine

## 2016-09-17 DIAGNOSIS — Z1231 Encounter for screening mammogram for malignant neoplasm of breast: Secondary | ICD-10-CM

## 2016-12-25 ENCOUNTER — Telehealth: Payer: Self-pay | Admitting: Neurology

## 2016-12-25 NOTE — Telephone Encounter (Signed)
Delaney [facility]igsamara [facility] called for Duwayne HeckDanielle, asking to be called back re: botox [phone]

## 2016-12-30 NOTE — Telephone Encounter (Signed)
Melody Juarez has called requesting Danielle to give her a call back, she stated this is her 2nd call since the 23rd of this month re: Botox for pt.  She is asking to be called at [phone]

## 2017-01-26 NOTE — Telephone Encounter (Signed)
I called Melody Juarez to schedule injections for this patient and another patient. She did not answer so I left a VM asking her to call me back.

## 2017-02-09 ENCOUNTER — Encounter (INDEPENDENT_AMBULATORY_CARE_PROVIDER_SITE_OTHER): Payer: Self-pay

## 2017-02-09 ENCOUNTER — Ambulatory Visit (INDEPENDENT_AMBULATORY_CARE_PROVIDER_SITE_OTHER): Payer: Medicare PPO | Admitting: Neurology

## 2017-02-09 ENCOUNTER — Encounter: Payer: Self-pay | Admitting: Neurology

## 2017-02-09 VITALS — BP 150/78 | HR 63 | Ht 66.5 in | Wt 172.0 lb

## 2017-02-09 DIAGNOSIS — R490 Dysphonia: Secondary | ICD-10-CM | POA: Diagnosis not present

## 2017-02-09 DIAGNOSIS — J383 Other diseases of vocal cords: Secondary | ICD-10-CM

## 2017-02-09 NOTE — Progress Notes (Signed)
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 Last injection by Dr. Jenne PaneBates was on July 29 2016  Under EMG guidance, 2.0 units of Botox A was injected into left, and 2.0 unit to right thyroarytenoid muscle,  There was occasionally mild spontaneous activity, polyphasic morphology motor unit potential, with normal recruitment were noted at bilateral thyroarytenoid muscle with normal recruitment patterns  Patient tolerate the injection well, will return to clinic in 3-6 months for repeat injection

## 2017-02-09 NOTE — Progress Notes (Signed)
**  Botox 100 units x 1 vial, NDC [id number], Lot [id number], Exp 07/2019, medication from Dr. Jenne PaneBates' office.//mck,rn**

## 2017-06-16 ENCOUNTER — Encounter: Payer: Self-pay | Admitting: Neurology

## 2017-06-16 ENCOUNTER — Ambulatory Visit (INDEPENDENT_AMBULATORY_CARE_PROVIDER_SITE_OTHER): Payer: Medicare PPO | Admitting: Neurology

## 2017-06-16 VITALS — Ht 66.5 in

## 2017-06-16 DIAGNOSIS — J383 Other diseases of vocal cords: Secondary | ICD-10-CM

## 2017-06-16 DIAGNOSIS — R490 Dysphonia: Secondary | ICD-10-CM

## 2017-06-16 NOTE — Progress Notes (Signed)
**  Botox 100 units x 1 vial, NDC [phone], Lot [id number], Exp 12/2019[facility]//mck,rn**

## 2017-06-16 NOTE — Progress Notes (Signed)
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 Last injection by Dr. Jenne PaneBates was on Feb 09 2017.  Under EMG guidance, 2.0 units of Botox A was injected into left, and 2.0 unit to right thyroarytenoid muscle,  There was occasionally mild spontaneous activity, polyphasic morphology motor unit potential, with normal recruitment were noted at bilateral thyroarytenoid muscle with normal recruitment patterns  Patient tolerate the injection well, will return to clinic in 3-6 months for repeat injection

## 2017-09-09 ENCOUNTER — Other Ambulatory Visit: Payer: Self-pay | Admitting: Internal Medicine

## 2017-09-09 DIAGNOSIS — Z1231 Encounter for screening mammogram for malignant neoplasm of breast: Secondary | ICD-10-CM

## 2017-09-23 ENCOUNTER — Ambulatory Visit: Payer: Medicare PPO

## 2017-10-22 ENCOUNTER — Ambulatory Visit
Admission: RE | Admit: 2017-10-22 | Discharge: 2017-10-22 | Disposition: A | Discharge status: Home / Self Care | Payer: Medicare PPO | Source: Ambulatory Visit | Attending: Internal Medicine | Admitting: Internal Medicine

## 2017-10-22 DIAGNOSIS — Z1231 Encounter for screening mammogram for malignant neoplasm of breast: Secondary | ICD-10-CM

## 2017-11-10 ENCOUNTER — Telehealth: Payer: Self-pay | Admitting: Neurology

## 2017-11-10 NOTE — Telephone Encounter (Signed)
Lisa/Dr Jenne PaneBates [phone] called setting up botox appt. Please call to advise

## 2017-11-11 NOTE — Telephone Encounter (Signed)
I called and spoke with Misty StanleyLisa with Dr. Jenne PaneBates regarding scheduling this patient. Misty StanleyLisa said that Dr. Jenne PaneBates would prefer a lunch time apt or first thing in the morning. Please call and advise.

## 2017-11-11 NOTE — Telephone Encounter (Signed)
Noted, thank you

## 2017-11-11 NOTE — Telephone Encounter (Addendum)
Patient has been placed on the schedule for 11/29/17.  Arrival time 11:15am for 11:30 am appt.  [facility] and left the appt information on her voicemail.  I told her to please call us back if this time did not work for the patient or Dr. Jenne PaneBates.

## 2017-11-29 ENCOUNTER — Encounter (INDEPENDENT_AMBULATORY_CARE_PROVIDER_SITE_OTHER): Payer: Self-pay

## 2017-11-29 ENCOUNTER — Encounter: Payer: Self-pay | Admitting: Neurology

## 2017-11-29 ENCOUNTER — Ambulatory Visit: Payer: Medicare PPO | Admitting: Neurology

## 2017-11-29 ENCOUNTER — Ambulatory Visit: Payer: Self-pay | Admitting: Neurology

## 2017-11-29 VITALS — BP 142/72 | HR 68 | Ht 66.5 in | Wt 172.5 lb

## 2017-11-29 DIAGNOSIS — R490 Dysphonia: Secondary | ICD-10-CM

## 2017-11-29 DIAGNOSIS — J383 Other diseases of vocal cords: Secondary | ICD-10-CM

## 2017-11-29 NOTE — Progress Notes (Signed)
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 Last injection by Dr. Jenne PaneBates was on Sept 12 2018.  Under EMG guidance, 2.0 units of Botox A was injected into left, and 2.0 unit to right thyroarytenoid muscle,  There was occasionally mild spontaneous activity, polyphasic morphology motor unit potential, with normal recruitment were noted at bilateral thyroarytenoid muscle with normal recruitment patterns  Patient tolerate the injection well, will return to clinic in 3-6 months for repeat injection

## 2017-11-29 NOTE — Progress Notes (Signed)
**  Botox 100 units x 1 vials, NDC 0454-0981-190023-1145-01, Lot [id number], Exp 05/2020, medication from Dr. Jenne PaneBates' office. Daine Floras//mck,rn

## 2017-12-22 ENCOUNTER — Ambulatory Visit: Payer: Self-pay | Admitting: Neurology

## 2018-05-02 ENCOUNTER — Encounter: Payer: Self-pay | Admitting: Neurology

## 2018-05-02 ENCOUNTER — Ambulatory Visit (INDEPENDENT_AMBULATORY_CARE_PROVIDER_SITE_OTHER): Payer: Medicare PPO | Admitting: Neurology

## 2018-05-02 VITALS — BP 148/85 | HR 90 | Ht 66.5 in | Wt 174.0 lb

## 2018-05-02 DIAGNOSIS — R490 Dysphonia: Secondary | ICD-10-CM | POA: Diagnosis not present

## 2018-05-02 DIAGNOSIS — J383 Other diseases of vocal cords: Secondary | ICD-10-CM

## 2018-05-02 NOTE — Progress Notes (Signed)
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 Last injection by Dr. Jenne PaneBates was on Nov 29 2017.  Under EMG guidance, 2.5 units of Botox A was injected into left, and 2.5 unit to right thyroarytenoid muscle,  There was occasionally mild spontaneous activity, polyphasic morphology motor unit potential, with normal recruitment were noted at bilateral thyroarytenoid muscle with normal recruitment patterns  Patient tolerate the injection well, will return to clinic in 3-6 months for repeat injection

## 2018-05-02 NOTE — Progress Notes (Signed)
**  Botox 100 units x 1 vial, NDC 1610-9604-540023-1145-01, Lot [id number], Exp 05/2020, medication supplied by Dr. Jenne PaneBates' office.//,,mck, rn**

## 2018-09-06 ENCOUNTER — Other Ambulatory Visit: Payer: Self-pay | Admitting: Internal Medicine

## 2018-09-06 DIAGNOSIS — E2839 Other primary ovarian failure: Secondary | ICD-10-CM

## 2018-10-12 ENCOUNTER — Other Ambulatory Visit: Payer: Self-pay | Admitting: Internal Medicine

## 2018-10-12 DIAGNOSIS — Z1231 Encounter for screening mammogram for malignant neoplasm of breast: Secondary | ICD-10-CM

## 2018-11-11 ENCOUNTER — Ambulatory Visit: Payer: Medicare PPO

## 2018-12-05 ENCOUNTER — Inpatient Hospital Stay: Admission: RE | Admit: 2018-12-05 | Payer: Medicare PPO | Source: Ambulatory Visit

## 2018-12-22 ENCOUNTER — Ambulatory Visit: Payer: Self-pay

## 2019-02-06 ENCOUNTER — Ambulatory Visit: Payer: Medicare PPO | Admitting: Neurology

## 2019-03-08 ENCOUNTER — Ambulatory Visit (INDEPENDENT_AMBULATORY_CARE_PROVIDER_SITE_OTHER): Payer: Medicare PPO | Admitting: Neurology

## 2019-03-08 ENCOUNTER — Other Ambulatory Visit: Payer: Self-pay

## 2019-03-08 ENCOUNTER — Encounter: Payer: Self-pay | Admitting: Neurology

## 2019-03-08 VITALS — BP 162/92 | HR 86 | Temp 97.3°F | Ht 66.5 in | Wt 172.5 lb

## 2019-03-08 DIAGNOSIS — J385 Laryngeal spasm: Secondary | ICD-10-CM

## 2019-03-08 DIAGNOSIS — J383 Other diseases of vocal cords: Secondary | ICD-10-CM

## 2019-03-08 DIAGNOSIS — R490 Dysphonia: Secondary | ICD-10-CM

## 2019-03-08 NOTE — Progress Notes (Signed)
**  Botox 100 units x 1 vial, NDC 0023-1145-01, Lot [id number], Exp 08/2021..//mck,rn**  

## 2019-03-10 NOTE — Progress Notes (Signed)
Patient is injected by ENT physician Dr. Dwight Bates for spastic dysphonia, she was previously injected by outside physician. Last injection by Dr. Bates was on May 02 2018.  Under EMG guidance, 2.5 units of Botox A was injected into left, and 2.5 unit to right thyroarytenoid muscle,  There was occasionally mild spontaneous activity, polyphasic morphology motor unit potential, with normal recruitment were noted at bilateral thyroarytenoid muscle with normal recruitment patterns  Patient tolerates the injection well, will return to clinic in 3-6 months for repeat injection 

## 2019-04-11 ENCOUNTER — Ambulatory Visit
Admission: RE | Admit: 2019-04-11 | Discharge: 2019-04-11 | Disposition: A | Discharge status: Home / Self Care | Payer: Medicare Other | Source: Ambulatory Visit | Attending: Internal Medicine | Admitting: Internal Medicine

## 2019-04-11 ENCOUNTER — Other Ambulatory Visit: Payer: Self-pay

## 2019-04-11 ENCOUNTER — Ambulatory Visit: Payer: Self-pay

## 2019-04-11 DIAGNOSIS — E2839 Other primary ovarian failure: Secondary | ICD-10-CM

## 2019-04-11 DIAGNOSIS — Z1231 Encounter for screening mammogram for malignant neoplasm of breast: Secondary | ICD-10-CM

## 2019-07-27 ENCOUNTER — Telehealth: Payer: Self-pay | Admitting: Neurology

## 2019-07-27 NOTE — Telephone Encounter (Signed)
RN [name] @ Dr [facility] Baseman [facility] called for Andee Poles asking for a call to discuss coordinating Dr Krista Blue and Dr Redmond Baseman schedule re: Botox for pt.  Please call RN [name] @ [phone]

## 2019-07-31 NOTE — Telephone Encounter (Signed)
I called [name] back but she did not answer so I left a VM asking her to call me back. DW

## 2019-08-01 ENCOUNTER — Telehealth: Payer: Self-pay | Admitting: Neurology

## 2019-08-01 NOTE — Telephone Encounter (Signed)
[name] from [facility] called stating that Dr. Redmond Baseman can be here for the pt on Nov. 5th at either 8:20, 8:50 or 10:50 Please call her back and let her know which one is convenient.

## 2019-08-02 NOTE — Telephone Encounter (Signed)
I returned megans call to offer this date and time. Left a VM asking her to call back. When she calls back please offer her that date and time.

## 2019-08-09 NOTE — Telephone Encounter (Addendum)
Ok, per Dr. Krista Blue, to add this patient on at 11:30am on 08/17/2019.  Placed on schedule.

## 2019-08-09 NOTE — Telephone Encounter (Signed)
We can accomodate 11:30am or 4pm on 08/17/2019.

## 2019-08-09 NOTE — Telephone Encounter (Signed)
I spoke with Melody Juarez and this works for Dr. Redmond Baseman, would you please work in?

## 2019-08-09 NOTE — Telephone Encounter (Signed)
Sharyn Lull, are any of these an option? I would prefer the 12th because this is such short notice.

## 2019-08-09 NOTE — Telephone Encounter (Signed)
RN [name] @ Dr Bates Office called for Melody Juarez asking for a call to discuss coordinating Dr Yan and Dr Bates schedule re: Botox for pt.  Please call RN [name] @ [phone].  She states possible dates to consider are 11-09 4:00 and on Tues 11-10 11:00 and on 4:00 and on and Thurs 11-12 @11 & 4 :00 and on.  RN [name] is asking if Melody Juarez will pick a date and time and let her know and she will schedule on her end.  Please call °

## 2019-08-17 ENCOUNTER — Encounter: Payer: Self-pay | Admitting: Neurology

## 2019-08-17 ENCOUNTER — Ambulatory Visit (INDEPENDENT_AMBULATORY_CARE_PROVIDER_SITE_OTHER): Payer: Medicare Other | Admitting: Neurology

## 2019-08-17 ENCOUNTER — Other Ambulatory Visit: Payer: Self-pay

## 2019-08-17 VITALS — BP 132/72 | HR 68 | Temp 97.4°F | Ht 66.5 in | Wt 177.0 lb

## 2019-08-17 DIAGNOSIS — R490 Dysphonia: Secondary | ICD-10-CM

## 2019-08-17 DIAGNOSIS — J383 Other diseases of vocal cords: Secondary | ICD-10-CM

## 2019-08-17 MED ORDER — ONABOTULINUMTOXINA 100 UNITS IJ SOLR
100.0000 [IU] | Freq: Once | INTRAMUSCULAR | Status: AC
  Administered 2019-08-17: 100 [IU] via INTRAMUSCULAR

## 2019-08-17 NOTE — Progress Notes (Signed)
Patient is injected by ENT physician Dr. Melida Quitter for spastic dysphonia, she was previously injected by outside physician. Last injection by Dr. Redmond Baseman was on May 02 2018.  Under EMG guidance, 2.5 units of Botox A was injected into left, and 2.5 unit to right thyroarytenoid muscle,  There was occasionally mild spontaneous activity, polyphasic morphology motor unit potential, with normal recruitment were noted at bilateral thyroarytenoid muscle with normal recruitment patterns  Patient tolerates the injection well, will return to clinic in 3-6 months for repeat injection

## 2019-08-17 NOTE — Progress Notes (Signed)
**  Botox 100 units x 1 vials, NDC 0023-1145-01, Lot [id number], Exp 05/2022, office supply./mck,rn**  

## 2019-11-18 ENCOUNTER — Ambulatory Visit: Payer: Medicare Other | Attending: Internal Medicine

## 2019-11-18 DIAGNOSIS — Z23 Encounter for immunization: Secondary | ICD-10-CM

## 2019-11-18 NOTE — Progress Notes (Signed)
   Covid-19 Vaccination Clinic  Name:  Melody Juarez    MRN: [id number] DOB: 07/13/1946  11/18/2019  Melody Juarez was observed post Covid-19 immunization for 15 minutes without incidence. She was provided with Vaccine Information Sheet and instruction to access the V-Safe system.   Melody Juarez was instructed to call 911 with any severe reactions post vaccine: Marland Kitchen Difficulty breathing  . Swelling of your face and throat  . A fast heartbeat  . A bad rash all over your body  . Dizziness and weakness    Immunizations Administered    Name Date Dose VIS Date Route   Pfizer COVID-19 Vaccine 11/18/2019  2:01 PM 0.3 mL 09/15/2019 Intramuscular   Manufacturer: [employer], [employer]   Lot: [id number]   NDC: [address]-3

## 2019-12-11 ENCOUNTER — Ambulatory Visit: Payer: Medicare Other | Attending: Internal Medicine

## 2019-12-11 DIAGNOSIS — Z23 Encounter for immunization: Secondary | ICD-10-CM | POA: Insufficient documentation

## 2019-12-11 NOTE — Progress Notes (Signed)
   Covid-19 Vaccination Clinic  Name:  Melody Juarez    MRN: [id number] DOB: 1946-07-31  12/11/2019  Melody Juarez was observed post Covid-19 immunization for 15 minutes without incident. She was provided with Vaccine Information Sheet and instruction to access the V-Safe system.   Melody Juarez was instructed to call 911 with any severe reactions post vaccine: Marland Kitchen Difficulty breathing  . Swelling of face and throat  . A fast heartbeat  . A bad rash all over body  . Dizziness and weakness   Immunizations Administered    Name Date Dose VIS Date Route   Pfizer COVID-19 Vaccine 12/11/2019  2:09 PM 0.3 mL 09/15/2019 Intramuscular   Manufacturer: [employer], [employer]   Lot: [id number]   NDC: [address]-0

## 2020-01-30 ENCOUNTER — Telehealth: Payer: Self-pay | Admitting: Neurology

## 2020-01-30 NOTE — Telephone Encounter (Signed)
Pt is asking for a call to schedule Botox

## 2020-02-06 NOTE — Telephone Encounter (Signed)
I called patient.  I got her voicemail.  Message left that we are waiting on a call back from Dr. Jenne Pane office to we can coordinate the appointment with him as well. When I hear back from his office I will call her back.  If she has day or time preference please call so we can try to coordinate with her schedule as well as Dr. Jenne Pane and Dr Terrace Arabia.

## 2020-02-14 ENCOUNTER — Telehealth: Payer: Self-pay | Admitting: *Deleted

## 2020-02-14 NOTE — Telephone Encounter (Signed)
I called [employer] and spoke to [place] B.   She states J0585 and 94320 are valid and do not require PA.  Ref# for this call is [id number].

## 2020-02-14 NOTE — Telephone Encounter (Signed)
I called Melody Juarez [phone] with Dr. Jenne Pane office and we scheduled patient for Feb 19, 2020.  I called patient to make her aware and got no answer.  I left her a voicemail with the date and time to please call back if this will not work for her otherwise we will see her on Monday.

## 2020-02-19 ENCOUNTER — Encounter: Payer: Self-pay | Admitting: Neurology

## 2020-02-19 ENCOUNTER — Ambulatory Visit: Payer: Medicare Other | Admitting: Neurology

## 2020-02-19 ENCOUNTER — Other Ambulatory Visit: Payer: Self-pay

## 2020-02-19 VITALS — BP 132/87 | HR 72 | Temp 97.0°F | Ht 66.5 in | Wt 175.0 lb

## 2020-02-19 DIAGNOSIS — R490 Dysphonia: Secondary | ICD-10-CM

## 2020-02-19 DIAGNOSIS — J383 Other diseases of vocal cords: Secondary | ICD-10-CM

## 2020-02-19 NOTE — Progress Notes (Signed)
**  Botox 100 units x 1 vial, NDC 0923-3007-62, Lot [id number], Exp 07/2022, office supply.//mck,rn**

## 2020-02-28 NOTE — Progress Notes (Signed)
Patient is injected by ENT physician Dr. Dwight Bates for spastic dysphonia, she was previously injected by outside physician. Last injection by Dr. Bates was on Aug 25 2019.  Under EMG guidance, 2.5 units of Botox A was injected into left, and 2.5 unit to right thyroarytenoid muscle,  There was occasionally mild spontaneous activity, polyphasic morphology motor unit potential, with normal recruitment were noted at bilateral thyroarytenoid muscle with normal recruitment patterns  Patient tolerates the injection well, will return to clinic in 3-6 months for repeat injection 

## 2020-03-12 ENCOUNTER — Other Ambulatory Visit: Payer: Self-pay | Admitting: Internal Medicine

## 2020-03-12 DIAGNOSIS — Z1231 Encounter for screening mammogram for malignant neoplasm of breast: Secondary | ICD-10-CM

## 2020-04-12 ENCOUNTER — Ambulatory Visit: Payer: Medicare Other

## 2020-05-22 ENCOUNTER — Other Ambulatory Visit: Payer: Self-pay

## 2020-05-22 ENCOUNTER — Ambulatory Visit
Admission: RE | Admit: 2020-05-22 | Discharge: 2020-05-22 | Disposition: A | Discharge status: Home / Self Care | Payer: Medicare Other | Source: Ambulatory Visit | Attending: Internal Medicine | Admitting: Internal Medicine

## 2020-05-22 DIAGNOSIS — Z1231 Encounter for screening mammogram for malignant neoplasm of breast: Secondary | ICD-10-CM

## 2020-05-23 ENCOUNTER — Ambulatory Visit: Payer: Medicare Other | Admitting: Neurology

## 2020-07-18 ENCOUNTER — Encounter: Payer: Self-pay | Admitting: Neurology

## 2020-07-18 ENCOUNTER — Ambulatory Visit: Payer: Medicare Other | Admitting: Neurology

## 2020-07-18 VITALS — BP 128/78 | HR 68 | Wt 178.5 lb

## 2020-07-18 DIAGNOSIS — J383 Other diseases of vocal cords: Secondary | ICD-10-CM | POA: Diagnosis not present

## 2020-07-18 DIAGNOSIS — R490 Dysphonia: Secondary | ICD-10-CM | POA: Diagnosis not present

## 2020-07-18 NOTE — Progress Notes (Signed)
**  Botox 100 units x 1 vial, NDC 0023-1145-01, Lot [id number], Exp 11/2022, office supply.//mck,rn** 

## 2020-07-19 DIAGNOSIS — J383 Other diseases of vocal cords: Secondary | ICD-10-CM

## 2020-07-19 DIAGNOSIS — R490 Dysphonia: Secondary | ICD-10-CM

## 2020-07-19 MED ORDER — ONABOTULINUMTOXINA 100 UNITS IJ SOLR
100.0000 [IU] | Freq: Once | INTRAMUSCULAR | Status: AC
  Administered 2020-07-19: 100 [IU] via INTRAMUSCULAR

## 2020-07-19 NOTE — Progress Notes (Signed)
Patient is injected by ENT physician Dr. Christia Reading for spastic dysphonia, she was previously injected by outside physician. Last injection by Dr. Jenne Pane was on Aug 25 2019.  Under EMG guidance, 2.5 units of Botox A was injected into left, and 2.5 unit to right thyroarytenoid muscle,  There was occasionally mild spontaneous activity, polyphasic morphology motor unit potential, with normal recruitment were noted at bilateral thyroarytenoid muscle with normal recruitment patterns  Patient tolerates the injection well, will return to clinic in 3-6 months for repeat injection

## 2020-10-26 IMAGING — MG DIGITAL SCREENING BILAT W/ TOMO W/ CAD
8 series · 8 of 24 positions shown · non-contrast
Comparison: Previous exam(s).

CLINICAL DATA: Screening.

EXAM:
DIGITAL SCREENING BILATERAL MAMMOGRAM WITH TOMO AND CAD

[R CC synth-2D]
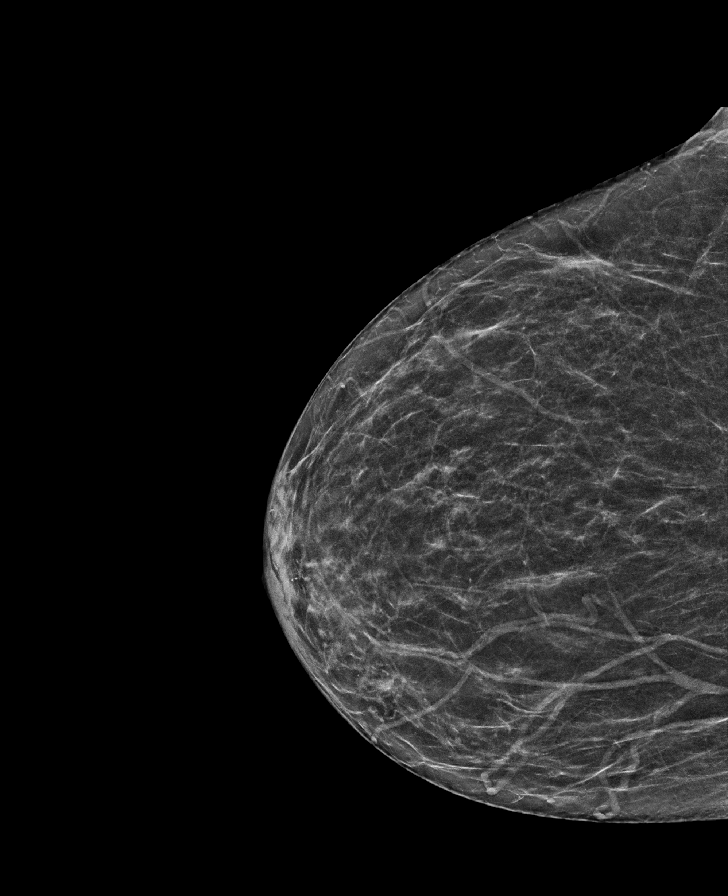

[R MLO synth-2D]
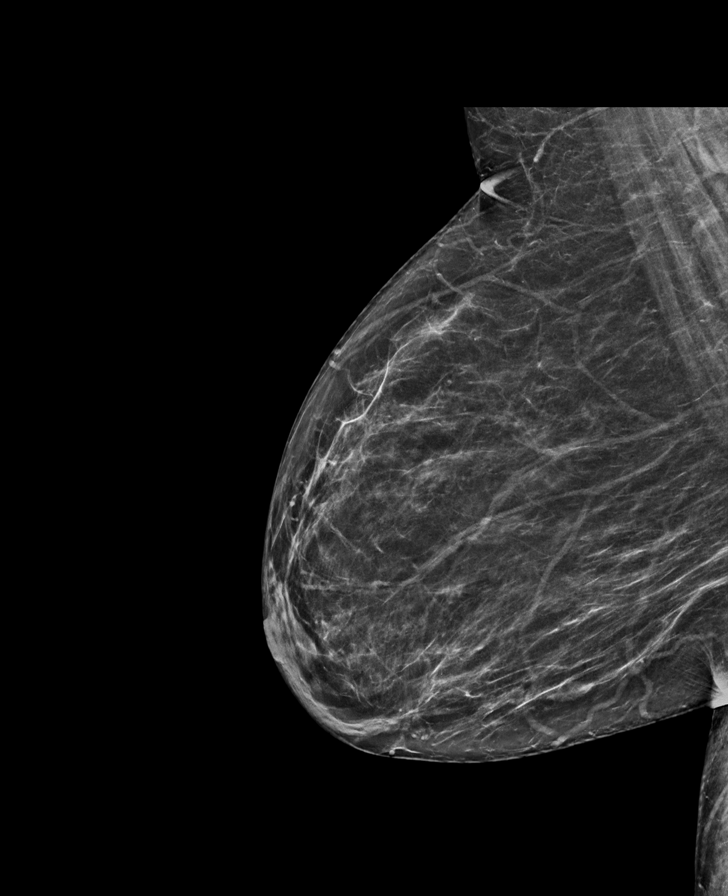

[L MLO synth-2D]
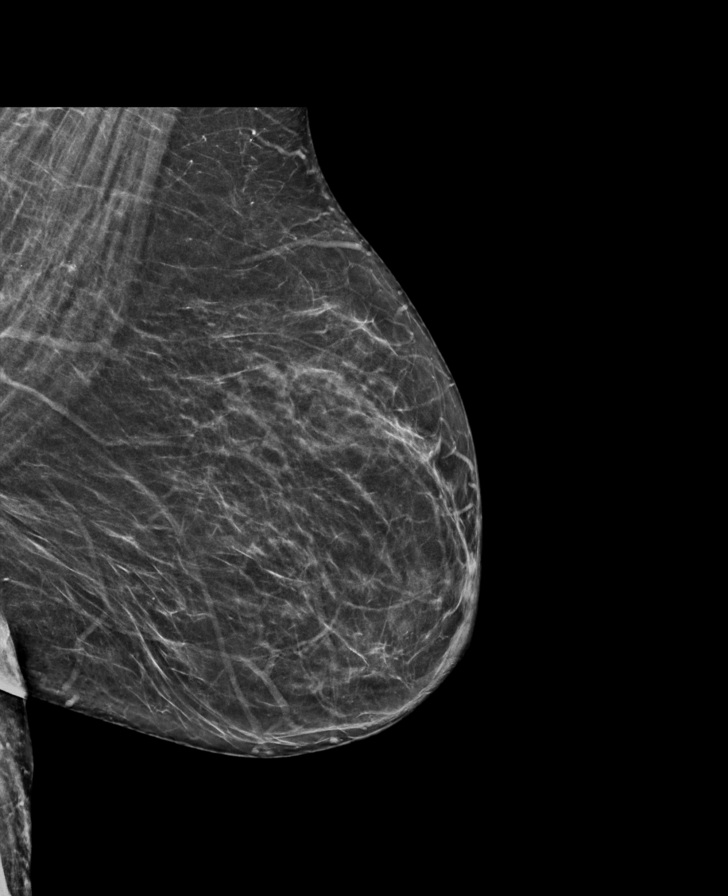

[L CC synth-2D]
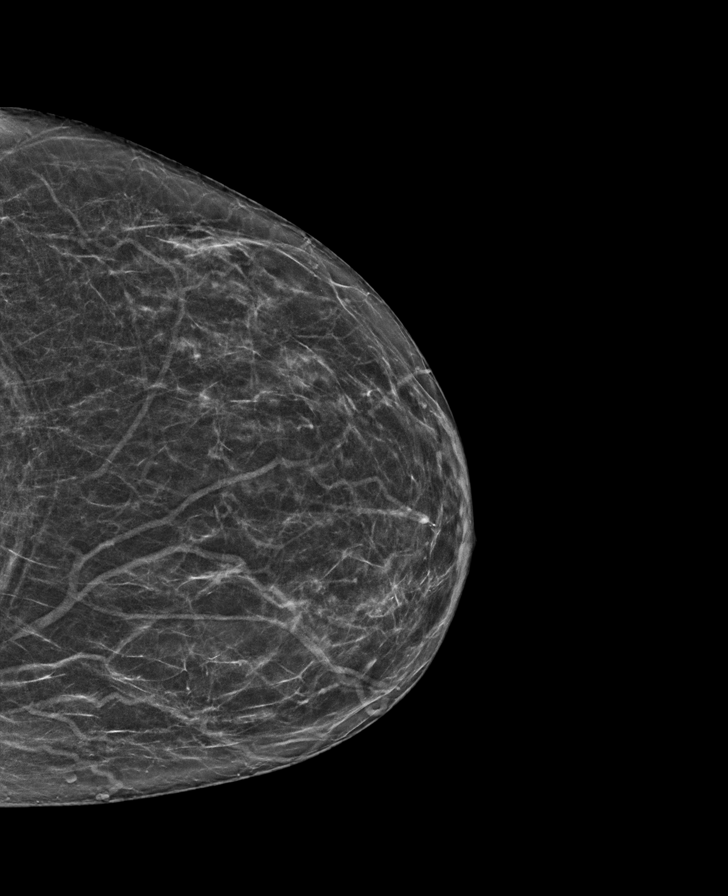

[R MLO tomo · tomo slice 34/67.0]
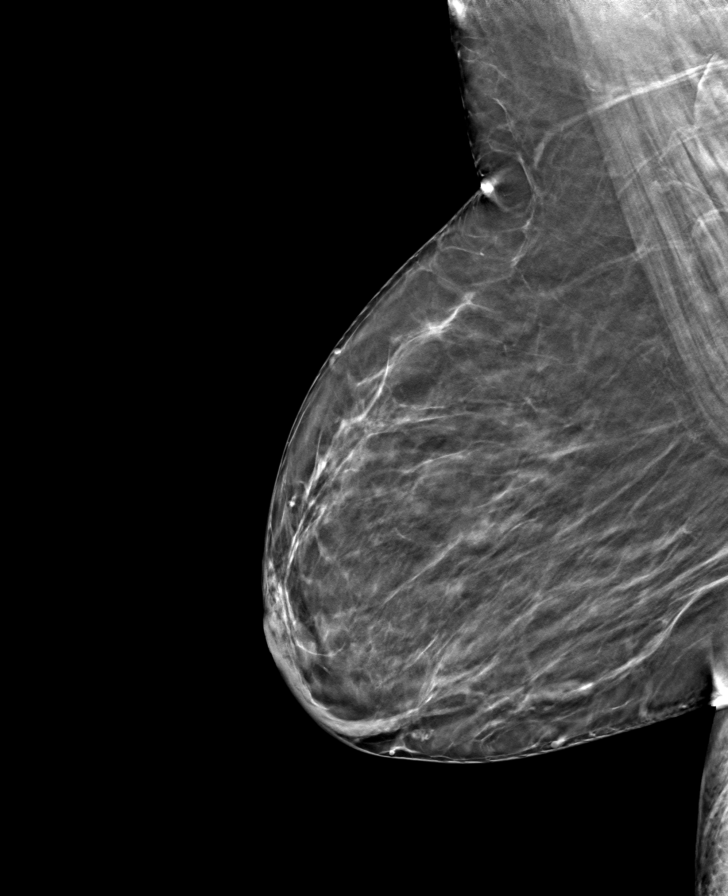

[R CC tomo · tomo slice 28/55.0]
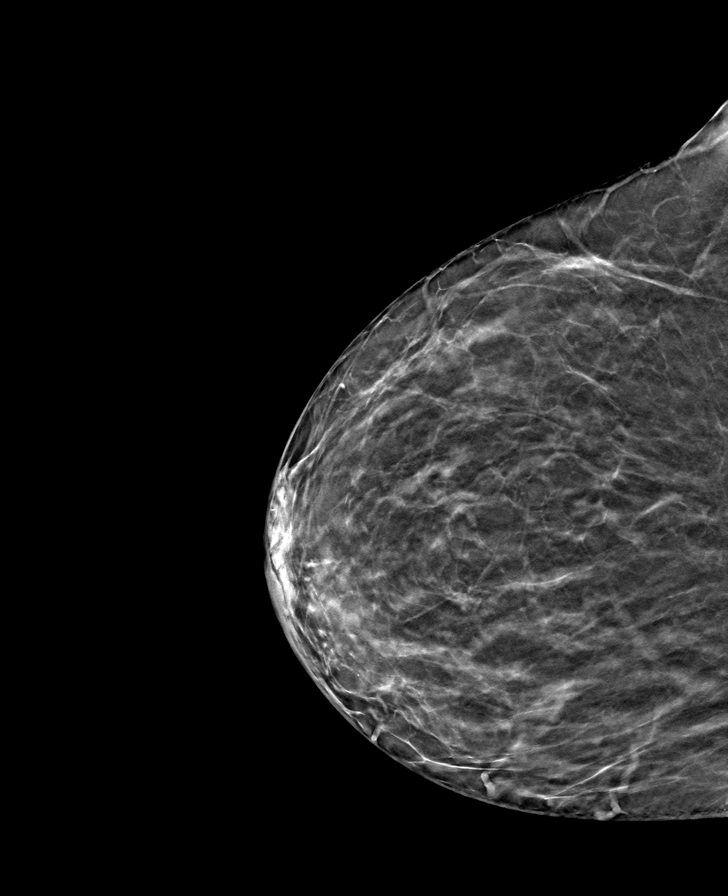

[L MLO tomo · tomo slice 33/66.0]
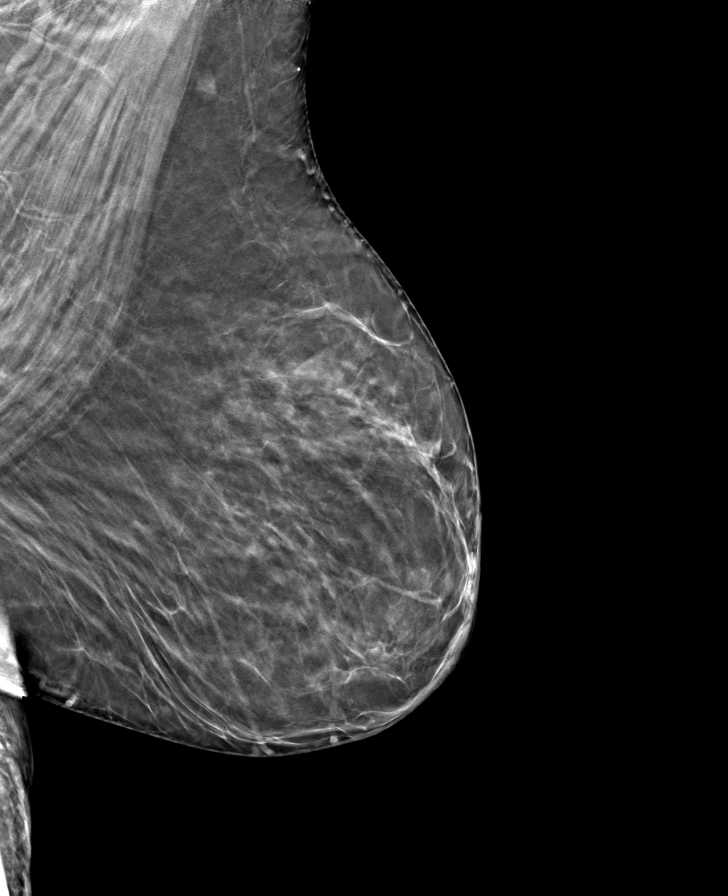

[L CC tomo · tomo slice 29/58.0]
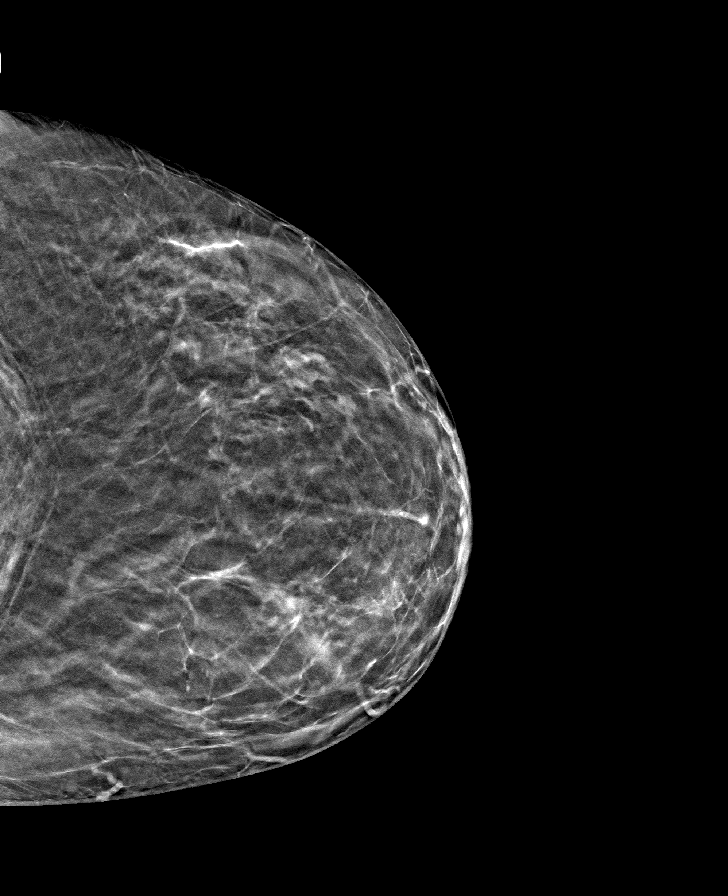

[8 of 24 positions shown; findings below may reference images not displayed]

ACR Breast Density Category b: There are scattered areas of
fibroglandular density.
FINDINGS: There are no findings suspicious for malignancy. Images were
processed with CAD.
IMPRESSION: No mammographic evidence of malignancy. A result letter of this
screening mammogram will be mailed directly to the patient.

RECOMMENDATION:
Screening mammogram in one year. (Code:[id number])

BI-RADS CATEGORY  1: Negative.

## 2020-11-29 ENCOUNTER — Other Ambulatory Visit: Payer: Self-pay | Admitting: Internal Medicine

## 2020-11-30 LAB — BASIC METABOLIC PANEL WITH GFR
BUN/Creatinine Ratio: 21 (calc) (ref 6–22)
BUN: 20 mg/dL (ref 7–25)
CO2: 25 mmol/L (ref 20–32)
Calcium: 8.8 mg/dL (ref 8.6–10.4)
Chloride: 109 mmol/L (ref 98–110)
Creat: 0.94 mg/dL — ABNORMAL HIGH (ref 0.60–0.93)
GFR, Est African American: 70 mL/min/{1.73_m2} (ref >=60)
GFR, Est Non African American: 60 mL/min/{1.73_m2} (ref >=60)
Glucose, Bld: 82 mg/dL (ref 65–99)
Potassium: 4.2 mmol/L (ref 3.5–5.3)
Sodium: 143 mmol/L (ref 135–146)

## 2020-11-30 LAB — CBC
HCT: 37.9 % (ref 35.0–45.0)
Hemoglobin: 12.4 g/dL (ref 11.7–15.5)
MCH: 32.5 pg (ref 27.0–33.0)
MCHC: 32.7 g/dL (ref 32.0–36.0)
MCV: 99.5 fL (ref 80.0–100.0)
MPV: 10.5 fL (ref 7.5–12.5)
Platelets: 261 10*3/uL (ref 140–400)
RBC: 3.81 10*6/uL (ref 3.80–5.10)
RDW: 10.7 % — ABNORMAL LOW (ref 11.0–15.0)
WBC: 4.1 10*3/uL (ref 3.8–10.8)

## 2020-12-12 ENCOUNTER — Telehealth: Payer: Self-pay | Admitting: Neurology

## 2020-12-12 NOTE — Telephone Encounter (Signed)
Patient will be coming in for a Botox injection 3/15 with Dr. Terrace Arabia and Dr. Jenne Pane. Due to insurance changes, will it be okay to use samples at this appointment?

## 2020-12-14 NOTE — Telephone Encounter (Signed)
She is using such small amount (5 units only), it is OK to use sample, better to be together with other injection patient.  Is it possible for Dr. Jenne Pane to come in on Wednesday PM with other injection, if not this week, next week would be fine.   Please coordinate.

## 2020-12-16 NOTE — Telephone Encounter (Signed)
I called Melody Juarez at Dr. Jenne Pane' office to discuss. She is out of the office today but I LVM asking her to call me when she gets in tomorrow. If I don't hear back from their office by 10:00 tomorrow, I will call the patient to let her know that we will need to reschedule.

## 2020-12-17 ENCOUNTER — Ambulatory Visit: Payer: Medicare Other | Admitting: Neurology

## 2020-12-17 NOTE — Telephone Encounter (Signed)
I called Dr. Jenne Pane' office to advise that we will need to reschedule patient's injection. I spoke with Angie (Dr. Jenne Pane' assistant) to let her know. She states that Raynelle Fanning, the person who normally schedules these injections, is out of the office. I advised I will call back to coordinate once she is back in. I called the patient to let her know.

## 2021-01-21 ENCOUNTER — Ambulatory Visit: Payer: Medicare Other | Admitting: Neurology

## 2021-01-28 ENCOUNTER — Ambulatory Visit: Payer: Medicare Other | Admitting: Neurology

## 2021-02-06 ENCOUNTER — Encounter: Payer: Self-pay | Admitting: Neurology

## 2021-02-06 ENCOUNTER — Ambulatory Visit: Payer: Medicare Other | Admitting: Neurology

## 2021-02-06 VITALS — BP 140/79 | HR 65 | Ht 64.0 in | Wt 183.5 lb

## 2021-02-06 DIAGNOSIS — R490 Dysphonia: Secondary | ICD-10-CM | POA: Diagnosis not present

## 2021-02-06 DIAGNOSIS — J383 Other diseases of vocal cords: Secondary | ICD-10-CM

## 2021-02-06 NOTE — Progress Notes (Signed)
Botox injection: NDC 0023-1145-01 Lot [id number] Exp 02/2021  

## 2021-02-07 ENCOUNTER — Encounter: Payer: Self-pay | Admitting: Neurology

## 2021-02-07 NOTE — Progress Notes (Signed)
Patient is injected by ENT physician Dr. Christia Reading for spastic dysphonia, she was previously injected by outside physician. Last injection by Dr. Jenne Pane was on July 14, 2020.  Under EMG guidance, 2.5 units of Botox A was injected into left, and 2.5 unit to right thyroarytenoid muscle,  There was occasionally mild spontaneous activity, polyphasic morphology motor unit potential, with normal recruitment were noted at bilateral thyroarytenoid muscle with normal recruitment patterns  Patient tolerates the injection well, will return to clinic in 3-6 months for repeat injection

## 2021-03-07 ENCOUNTER — Other Ambulatory Visit: Payer: Self-pay | Admitting: Internal Medicine

## 2021-03-08 LAB — LIPID PANEL
Cholesterol: 260 mg/dL — ABNORMAL HIGH (ref <200)
HDL: 88 mg/dL (ref >=50)
LDL Cholesterol (Calc): 154 mg/dL (calc) — ABNORMAL HIGH
Non-HDL Cholesterol (Calc): 172 mg/dL (calc) — ABNORMAL HIGH (ref <130)
Total CHOL/HDL Ratio: 3 (calc) (ref <5.0)
Triglycerides: 76 mg/dL (ref <150)

## 2021-03-08 LAB — COMPLETE METABOLIC PANEL WITH GFR
AG Ratio: 1.5 (calc) (ref 1.0–2.5)
ALT: 20 U/L (ref 6–29)
AST: 18 U/L (ref 10–35)
Albumin: 4 g/dL (ref 3.6–5.1)
Alkaline phosphatase (APISO): 42 U/L (ref 37–153)
BUN: 17 mg/dL (ref 7–25)
CO2: 26 mmol/L (ref 20–32)
Calcium: 9.2 mg/dL (ref 8.6–10.4)
Chloride: 107 mmol/L (ref 98–110)
Creat: 0.79 mg/dL (ref 0.60–0.93)
GFR, Est African American: 86 mL/min/{1.73_m2} (ref >=60)
GFR, Est Non African American: 74 mL/min/{1.73_m2} (ref >=60)
Globulin: 2.7 g/dL (calc) (ref 1.9–3.7)
Glucose, Bld: 79 mg/dL (ref 65–99)
Potassium: 4.1 mmol/L (ref 3.5–5.3)
Sodium: 143 mmol/L (ref 135–146)
Total Bilirubin: 0.6 mg/dL (ref 0.2–1.2)
Total Protein: 6.7 g/dL (ref 6.1–8.1)

## 2021-03-08 LAB — TSH: TSH: 1.72 mIU/L (ref 0.40–4.50)

## 2021-03-08 LAB — CBC
HCT: 39 % (ref 35.0–45.0)
Hemoglobin: 12.3 g/dL (ref 11.7–15.5)
MCH: 31.7 pg (ref 27.0–33.0)
MCHC: 31.5 g/dL — ABNORMAL LOW (ref 32.0–36.0)
MCV: 100.5 fL — ABNORMAL HIGH (ref 80.0–100.0)
MPV: 9.6 fL (ref 7.5–12.5)
Platelets: 264 10*3/uL (ref 140–400)
RBC: 3.88 10*6/uL (ref 3.80–5.10)
RDW: 10.9 % — ABNORMAL LOW (ref 11.0–15.0)
WBC: 3.4 10*3/uL — ABNORMAL LOW (ref 3.8–10.8)

## 2021-03-11 ENCOUNTER — Other Ambulatory Visit: Payer: Self-pay | Admitting: Internal Medicine

## 2021-03-11 DIAGNOSIS — E2839 Other primary ovarian failure: Secondary | ICD-10-CM

## 2021-03-17 ENCOUNTER — Other Ambulatory Visit: Payer: Self-pay | Admitting: Internal Medicine

## 2021-03-17 DIAGNOSIS — Z1231 Encounter for screening mammogram for malignant neoplasm of breast: Secondary | ICD-10-CM

## 2021-03-20 ENCOUNTER — Ambulatory Visit
Admission: RE | Admit: 2021-03-20 | Discharge: 2021-03-20 | Disposition: A | Discharge status: Home / Self Care | Payer: Medicare Other | Source: Ambulatory Visit | Attending: Internal Medicine | Admitting: Internal Medicine

## 2021-03-20 ENCOUNTER — Other Ambulatory Visit: Payer: Self-pay

## 2021-03-20 DIAGNOSIS — E2839 Other primary ovarian failure: Secondary | ICD-10-CM

## 2021-05-27 ENCOUNTER — Telehealth: Payer: Self-pay | Admitting: Neurology

## 2021-05-27 NOTE — Telephone Encounter (Signed)
Patient called wanting to get a Botox injection scheduled with Dr. Terrace Arabia & Dr. Jenne Pane. I called Dr. Jenne Pane' office to coordinate with his assistant Misty Stanley. LVM asking her to return call.

## 2021-06-17 ENCOUNTER — Encounter: Payer: Self-pay | Admitting: Neurology

## 2021-06-17 ENCOUNTER — Ambulatory Visit: Payer: Medicare Other | Admitting: Neurology

## 2021-06-17 VITALS — BP 112/53 | HR 69 | Ht 64.0 in | Wt 172.0 lb

## 2021-06-17 DIAGNOSIS — J383 Other diseases of vocal cords: Secondary | ICD-10-CM

## 2021-06-17 DIAGNOSIS — R490 Dysphonia: Secondary | ICD-10-CM

## 2021-06-17 NOTE — Progress Notes (Signed)
Patient is injected by ENT physician Dr. Christia Reading for spastic dysphonia, she was previously injected by outside physician. Last injection by Dr. Jenne Pane was on 02/06/2021  Under EMG guidance, 2.5 units of Botox A was injected into left, and 2.5 unit to right thyroarytenoid muscle,  There was occasionally mild spontaneous activity, polyphasic morphology motor unit potential, with normal recruitment were noted at bilateral thyroarytenoid muscle with normal recruitment patterns  Patient tolerates the injection well, will return to clinic in 3-6 months for repeat injection

## 2021-08-01 ENCOUNTER — Other Ambulatory Visit: Payer: Self-pay | Admitting: Internal Medicine

## 2021-08-02 LAB — LIPID PANEL
Cholesterol: 327 mg/dL — ABNORMAL HIGH (ref <200)
HDL: 99 mg/dL (ref >=50)
LDL Cholesterol (Calc): 208 mg/dL (calc) — ABNORMAL HIGH
Non-HDL Cholesterol (Calc): 228 mg/dL (calc) — ABNORMAL HIGH (ref <130)
Total CHOL/HDL Ratio: 3.3 (calc) (ref <5.0)
Triglycerides: 88 mg/dL (ref <150)

## 2021-08-02 LAB — EXTRA LAV TOP TUBE

## 2021-08-19 ENCOUNTER — Ambulatory Visit: Payer: Medicare Other | Admitting: Podiatry

## 2021-09-03 ENCOUNTER — Other Ambulatory Visit: Payer: Medicare Other

## 2021-09-08 ENCOUNTER — Other Ambulatory Visit: Payer: Self-pay

## 2021-09-08 ENCOUNTER — Ambulatory Visit
Admission: RE | Admit: 2021-09-08 | Discharge: 2021-09-08 | Disposition: A | Discharge status: Home / Self Care | Payer: Medicare Other | Source: Ambulatory Visit | Attending: Internal Medicine | Admitting: Internal Medicine

## 2021-09-22 ENCOUNTER — Ambulatory Visit (INDEPENDENT_AMBULATORY_CARE_PROVIDER_SITE_OTHER): Payer: Self-pay | Admitting: Podiatry

## 2021-09-22 DIAGNOSIS — Z91199 Patient's noncompliance with other medical treatment and regimen due to unspecified reason: Secondary | ICD-10-CM

## 2021-09-22 NOTE — Progress Notes (Signed)
Patient was no-show for appointment today 

## 2021-09-23 ENCOUNTER — Ambulatory Visit: Payer: Medicare Other | Admitting: Podiatry

## 2021-09-23 ENCOUNTER — Ambulatory Visit (INDEPENDENT_AMBULATORY_CARE_PROVIDER_SITE_OTHER): Payer: Medicare Other

## 2021-09-23 ENCOUNTER — Other Ambulatory Visit: Payer: Self-pay

## 2021-09-23 DIAGNOSIS — L819 Disorder of pigmentation, unspecified: Secondary | ICD-10-CM | POA: Diagnosis not present

## 2021-09-23 DIAGNOSIS — M722 Plantar fascial fibromatosis: Secondary | ICD-10-CM

## 2021-09-23 MED ORDER — METHYLPREDNISOLONE 4 MG PO TBPK: ORAL_TABLET | ORAL | 0 refills | Status: DC

## 2021-09-23 NOTE — Patient Instructions (Signed)

## 2021-09-25 ENCOUNTER — Encounter: Payer: Self-pay | Admitting: Podiatry

## 2021-09-25 NOTE — Progress Notes (Signed)
°  Subjective:  Patient ID: Melody Juarez, female    DOB: 28-Mar-1946,  MRN: [id number]  Chief Complaint  Patient presents with   Plantar Fasciitis    np-plantar fasciitis left-non req-dr. Fleet Contras refer    75 y.o. female presents with the above complaint. History confirmed with patient.  She said heel pain since Halloween.  She noticed that she got up to go to the bathroom and started hurting.  She has been doing stretches and is still having pain.  Objective:  Physical Exam: warm, good capillary refill, no trophic changes or ulcerative lesions, normal DP and PT pulses, and normal sensory exam. Left Foot: point tenderness over the heel pad and point tenderness of the mid plantar fascia, lateral foot there is a pigmented skin lesion she thinks its been there for some time Right Foot: Pigmented skin lesion plantar heel     Radiographs: Multiple views x-ray of the left foot: no fracture, dislocation, swelling or degenerative changes noted and plantar calcaneal spur Assessment:   1. Plantar fasciitis   2. Pigmented skin lesion of uncertain nature      Plan:  Patient was evaluated and treated and all questions answered.  Discussed the etiology and treatment options for plantar fasciitis including stretching, formal physical therapy, supportive shoegears such as a running shoe or sneaker, pre fabricated orthoses, injection therapy, and oral medications. We also discussed the role of surgical treatment of this for patients who do not improve after exhausting non-surgical treatment options.   -XR reviewed with patient -Educated patient on stretching and icing of the affected limb -Injection delivered to the plantar fascia of the left foot. -Rx for medrol pack. Educated on use, risks, and benefits of the medication -She cannot tolerate NSAIDs  She has multiple skin lesions that are pigmented flat and benign-appearing.  Photographs were taken we will continue to monitor these.   Discussed the need for biopsy if these begin to change.  She will keep me informed on these Return in about 1 month (around 10/24/2021) for recheck plantar fasciitis.

## 2021-10-06 ENCOUNTER — Other Ambulatory Visit: Payer: Self-pay | Admitting: Internal Medicine

## 2021-10-08 LAB — LIPID PANEL
Cholesterol: 332 mg/dL — ABNORMAL HIGH (ref <200)
HDL: 91 mg/dL (ref >=50)
LDL Cholesterol (Calc): 215 mg/dL (calc) — ABNORMAL HIGH
Non-HDL Cholesterol (Calc): 241 mg/dL (calc) — ABNORMAL HIGH (ref <130)
Total CHOL/HDL Ratio: 3.6 (calc) (ref <5.0)
Triglycerides: 115 mg/dL (ref <150)

## 2021-10-08 LAB — EXTRA LAV TOP TUBE

## 2021-10-28 ENCOUNTER — Ambulatory Visit: Payer: Medicare Other | Admitting: Podiatry

## 2021-11-12 ENCOUNTER — Telehealth: Payer: Self-pay | Admitting: Neurology

## 2021-11-12 NOTE — Telephone Encounter (Signed)
Called pt to get Botox appt schedule. Vm box is full.

## 2021-12-04 NOTE — Telephone Encounter (Signed)
Mcarthur Rossetti at Dr. Redmond Baseman office to schedule appt. Patient is scheduled on 03/28. ?

## 2021-12-30 ENCOUNTER — Other Ambulatory Visit: Payer: Self-pay

## 2021-12-30 ENCOUNTER — Ambulatory Visit (INDEPENDENT_AMBULATORY_CARE_PROVIDER_SITE_OTHER): Payer: Medicare Other | Admitting: Neurology

## 2021-12-30 DIAGNOSIS — J383 Other diseases of vocal cords: Secondary | ICD-10-CM | POA: Diagnosis not present

## 2021-12-30 DIAGNOSIS — R490 Dysphonia: Secondary | ICD-10-CM | POA: Diagnosis not present

## 2021-12-30 NOTE — Progress Notes (Signed)
Patient is injected by ENT physician Dr. Dwight Bates for spastic dysphonia, she was previously injected by outside physician. Last injection by Dr. Bates was on 02/06/2021  Under EMG guidance, 2.5 units of Botox A was injected into left, and 2.5 unit to right thyroarytenoid muscle,  There was occasionally mild spontaneous activity, polyphasic morphology motor unit potential, with normal recruitment were noted at bilateral thyroarytenoid muscle with normal recruitment patterns  Patient tolerates the injection well, will return to clinic in 3-6 months for repeat injection 

## 2021-12-30 NOTE — Progress Notes (Addendum)
Botox100 units ?Ndc-0023-1145-01 ?[id number] ?Exp-02/2024 ?B/B ?

## 2022-01-02 ENCOUNTER — Other Ambulatory Visit: Payer: Self-pay | Admitting: Internal Medicine

## 2022-01-03 LAB — HEPATIC FUNCTION PANEL
AG Ratio: 1.7 (calc) (ref 1.0–2.5)
ALT: 21 U/L (ref 6–29)
AST: 16 U/L (ref 10–35)
Albumin: 4 g/dL (ref 3.6–5.1)
Alkaline phosphatase (APISO): 42 U/L (ref 37–153)
Bilirubin, Direct: 0.1 mg/dL (ref 0.0–0.2)
Globulin: 2.4 g/dL (calc) (ref 1.9–3.7)
Indirect Bilirubin: 0.4 mg/dL (calc) (ref 0.2–1.2)
Total Bilirubin: 0.5 mg/dL (ref 0.2–1.2)
Total Protein: 6.4 g/dL (ref 6.1–8.1)

## 2022-01-03 LAB — LIPID PANEL
Cholesterol: 304 mg/dL — ABNORMAL HIGH (ref <200)
HDL: 95 mg/dL (ref >=50)
LDL Cholesterol (Calc): 189 mg/dL (calc) — ABNORMAL HIGH
Non-HDL Cholesterol (Calc): 209 mg/dL (calc) — ABNORMAL HIGH (ref <130)
Total CHOL/HDL Ratio: 3.2 (calc) (ref <5.0)
Triglycerides: 86 mg/dL (ref <150)

## 2022-01-03 LAB — EXTRA LAV TOP TUBE

## 2022-03-26 ENCOUNTER — Ambulatory Visit: Payer: Medicare Other | Admitting: Neurology

## 2022-06-22 ENCOUNTER — Telehealth: Payer: Self-pay | Admitting: Neurology

## 2022-06-22 NOTE — Telephone Encounter (Signed)
Dr. Bates office called to schedule Botox appt. Staff said, Dr. Bates would not be able to come to appt on 06/24/22 and discuss with patient; ok to reschedule. Pt was ok to reschedule Botox appt. 

## 2022-06-22 NOTE — Telephone Encounter (Signed)
Patient rescheduled for 08/05/2022.

## 2022-06-24 ENCOUNTER — Ambulatory Visit: Payer: Medicare Other | Admitting: Neurology

## 2022-06-29 ENCOUNTER — Other Ambulatory Visit: Payer: Self-pay | Admitting: Internal Medicine

## 2022-06-30 LAB — CBC
HCT: 36.3 % (ref 35.0–45.0)
Hemoglobin: 12.1 g/dL (ref 11.7–15.5)
MCH: 32.5 pg (ref 27.0–33.0)
MCHC: 33.3 g/dL (ref 32.0–36.0)
MCV: 97.6 fL (ref 80.0–100.0)
MPV: 10.1 fL (ref 7.5–12.5)
Platelets: 245 10*3/uL (ref 140–400)
RBC: 3.72 10*6/uL — ABNORMAL LOW (ref 3.80–5.10)
RDW: 10.8 % — ABNORMAL LOW (ref 11.0–15.0)
WBC: 3.5 10*3/uL — ABNORMAL LOW (ref 3.8–10.8)

## 2022-06-30 LAB — LIPID PANEL
Cholesterol: 295 mg/dL — ABNORMAL HIGH (ref <200)
HDL: 76 mg/dL (ref >=50)
LDL Cholesterol (Calc): 200 mg/dL (calc) — ABNORMAL HIGH
Non-HDL Cholesterol (Calc): 219 mg/dL (calc) — ABNORMAL HIGH (ref <130)
Total CHOL/HDL Ratio: 3.9 (calc) (ref <5.0)
Triglycerides: 83 mg/dL (ref <150)

## 2022-06-30 LAB — COMPLETE METABOLIC PANEL WITH GFR
AG Ratio: 1.6 (calc) (ref 1.0–2.5)
ALT: 18 U/L (ref 6–29)
AST: 19 U/L (ref 10–35)
Albumin: 3.9 g/dL (ref 3.6–5.1)
Alkaline phosphatase (APISO): 36 U/L — ABNORMAL LOW (ref 37–153)
BUN: 16 mg/dL (ref 7–25)
CO2: 24 mmol/L (ref 20–32)
Calcium: 8.6 mg/dL (ref 8.6–10.4)
Chloride: 108 mmol/L (ref 98–110)
Creat: 0.8 mg/dL (ref 0.60–1.00)
Globulin: 2.5 g/dL (calc) (ref 1.9–3.7)
Glucose, Bld: 74 mg/dL (ref 65–99)
Potassium: 4.2 mmol/L (ref 3.5–5.3)
Sodium: 142 mmol/L (ref 135–146)
Total Bilirubin: 0.6 mg/dL (ref 0.2–1.2)
Total Protein: 6.4 g/dL (ref 6.1–8.1)
eGFR: 77 mL/min/{1.73_m2} (ref >=60)

## 2022-06-30 LAB — TSH: TSH: 1.72 mIU/L (ref 0.40–4.50)

## 2022-07-07 ENCOUNTER — Other Ambulatory Visit: Payer: Self-pay | Admitting: Internal Medicine

## 2022-07-07 DIAGNOSIS — Z1231 Encounter for screening mammogram for malignant neoplasm of breast: Secondary | ICD-10-CM

## 2022-07-21 ENCOUNTER — Ambulatory Visit: Payer: Medicare Other | Admitting: Neurology

## 2022-07-21 VITALS — BP 140/69 | HR 55 | Ht 64.0 in | Wt 172.0 lb

## 2022-07-21 DIAGNOSIS — R490 Dysphonia: Secondary | ICD-10-CM

## 2022-07-21 DIAGNOSIS — J383 Other diseases of vocal cords: Secondary | ICD-10-CM | POA: Diagnosis not present

## 2022-07-21 NOTE — Progress Notes (Signed)
Botox 100 units x 1 vial  Ndc-0023-1145-01 [id number]  Exp-02/26 B/b 

## 2022-07-26 NOTE — Progress Notes (Signed)
Patient is injected by ENT physician Dr. Melida Quitter for spastic dysphonia,   Under EMG guidance, 2.5 units of Botox A was injected into left, and 2.5 unit to right thyroarytenoid muscle,  There was occasionally mild spontaneous activity, polyphasic morphology motor unit potential, with normal recruitment were noted at bilateral thyroarytenoid muscle with normal recruitment patterns  Patient tolerates the injection well, will return to clinic in 3-6 months for repeat injection

## 2022-07-29 ENCOUNTER — Inpatient Hospital Stay: Admission: RE | Admit: 2022-07-29 | Payer: Medicare Other | Source: Ambulatory Visit

## 2022-08-05 ENCOUNTER — Ambulatory Visit: Payer: Medicare Other | Admitting: Neurology

## 2022-08-19 ENCOUNTER — Ambulatory Visit: Payer: Medicare Other

## 2022-10-13 ENCOUNTER — Ambulatory Visit: Payer: Medicare Other

## 2022-11-17 ENCOUNTER — Ambulatory Visit
Admission: RE | Admit: 2022-11-17 | Discharge: 2022-11-17 | Disposition: A | Discharge status: Home / Self Care | Payer: Medicare Other | Source: Ambulatory Visit | Attending: Internal Medicine | Admitting: Internal Medicine

## 2022-11-17 DIAGNOSIS — Z1231 Encounter for screening mammogram for malignant neoplasm of breast: Secondary | ICD-10-CM

## 2022-12-28 ENCOUNTER — Other Ambulatory Visit: Payer: Self-pay | Admitting: Internal Medicine

## 2022-12-29 LAB — EXTRA LAV TOP TUBE

## 2022-12-29 LAB — LIPID PANEL
Cholesterol: 289 mg/dL — ABNORMAL HIGH (ref <200)
HDL: 101 mg/dL (ref >=50)
LDL Cholesterol (Calc): 170 mg/dL (calc) — ABNORMAL HIGH
Non-HDL Cholesterol (Calc): 188 mg/dL (calc) — ABNORMAL HIGH (ref <130)
Total CHOL/HDL Ratio: 2.9 (calc) (ref <5.0)
Triglycerides: 75 mg/dL (ref <150)

## 2023-02-02 ENCOUNTER — Encounter: Payer: Self-pay | Admitting: Neurology

## 2023-02-02 ENCOUNTER — Ambulatory Visit: Payer: Medicare Other | Admitting: Neurology

## 2023-02-02 VITALS — BP 120/62 | HR 67 | Ht 64.0 in | Wt 172.0 lb

## 2023-02-02 DIAGNOSIS — J383 Other diseases of vocal cords: Secondary | ICD-10-CM

## 2023-02-02 MED ORDER — ONABOTULINUMTOXINA 100 UNITS IJ SOLR: 100.0000 [IU] | Freq: Once | INTRAMUSCULAR | Status: AC

## 2023-02-02 NOTE — Progress Notes (Unsigned)
Botox- 200 units x 1 vial Lot: [id number] Expiration: 03/2025 NDC: 5409-8119-14  Bacteriostatic 0.9% Sodium Chloride-82ml Lot: [id number] Expiration: 08/2024 NDC: 13086-578-46  Dx: j38.3  B/B DRAWN UP by Dr. Jenne Pane ( 0.82ml total witnessed by Athena Masse RN)

## 2023-02-04 NOTE — Progress Notes (Signed)
Patient is injected by ENT physician Dr. Dwight Bates for spastic dysphonia,   Under EMG guidance, 2.5 units of Botox A was injected into left, and 2.5 unit to right thyroarytenoid muscle,  There was occasionally mild spontaneous activity, polyphasic morphology motor unit potential, with normal recruitment were noted at bilateral thyroarytenoid muscle with normal recruitment patterns  Patient tolerates the injection well, will return to clinic in 3-6 months for repeat injection 

## 2023-03-15 ENCOUNTER — Ambulatory Visit: Payer: Medicare Other | Admitting: Podiatry

## 2023-03-15 ENCOUNTER — Other Ambulatory Visit: Payer: Self-pay | Admitting: Podiatry

## 2023-03-15 ENCOUNTER — Ambulatory Visit (INDEPENDENT_AMBULATORY_CARE_PROVIDER_SITE_OTHER): Payer: Medicare Other

## 2023-03-15 DIAGNOSIS — M7661 Achilles tendinitis, right leg: Secondary | ICD-10-CM

## 2023-03-15 DIAGNOSIS — M67873 Other specified disorders of tendon, right ankle and foot: Secondary | ICD-10-CM

## 2023-03-15 DIAGNOSIS — R609 Edema, unspecified: Secondary | ICD-10-CM

## 2023-03-15 NOTE — Progress Notes (Addendum)
Chief Complaint  Patient presents with   Foot Pain    Swelling on the achilles of the foot pt states she noticed the swelling about 2 months ago.     HPI: 77 y.o. female presenting today with c/o pain in the back of the right heel.  Denies trauma.  Notes it is very tender when pressure is on the area.  Pressure from an ottoman can be painful.  She states that daily walking is not as painful.  Denies feeling a pop or snapping sensation.  No past medical history on file.  Past Surgical History:  Procedure Laterality Date   CESAREAN SECTION      Allergies  Allergen Reactions   Ibuprofen Shortness Of Breath     Physical Exam: General: The patient is alert and oriented x3 in no acute distress.  Dermatology:  No ecchymosis, erythema, or edema bilateral.  No open lesions.    Vascular: Palpable pedal pulses bilaterally. Capillary refill within normal limits.  No appreciable edema.    Neurological: Light touch sensation intact bilateral.  MMT 5/5 to lower extremity bilateral. Negative Tinel's sign with percussion of the posterior tibial nerve on the affected extremity.    Musculoskeletal Exam:  There is pain on palpation of the posterior aspect of the right heel.  There is a palpable painful soft tissue mass/thickening of the tendon approximately 4 cm superior to the Achilles insertion into the calcaneus.  Resisted plantarflexion does not produce pain.  5/5 manual muscle testing plantarflexion of the foot to the leg on the right.  Antalgic gait noted with first steps out of exam chair.  No pain on palpation of the plantar heel.  No ecchymosis  to posterior heel.  Radiographic Exam (3 weightbearing views obtained of the right foot/ankle 03/15/2023):  Normal osseous mineralization. Joint spaces preserved.  No fractures or osseous irregularities noted.  There is well-defined marked thickening of the Achilles tendon approximately 4 to 5 cm superior to its insertion into the calcaneus.  No  visible gaps are seen in the Achilles tendon shadow.  Assessment/Plan of Care: 1. Edema, unspecified type   2. Tendonitis, Achilles, right     MR ANKLE RIGHT WO CONTRAST  -Reviewed etiology of achilles tendonitis with patient.  For the patient that due to the mass and tenderness on palpation of it we would be best served obtaining an MRI of the right Achilles tendon to evaluate if this is Achilles tendinitis versus tendinopathy versus partial tear versus soft tissue mass.  She was in agreement.  Will get her set up for the MRI right ankle without contrast at the earliest convenience.  Will have her follow up after the MRI to review.  In the meantime she is instructed to gently massage Voltaren gel into the area at least twice daily.  Avoid any strenuous or high impact activities until the MRI is completed.  Return in about 3 weeks (around 04/05/2023) for to review MRI results .  ---------------------------------- 04/12/23: ADDENDUM:  Called patient to review MRI.  Tendinosis of right achilles.  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  Agreed to go to physical therapy for a month, after her colonoscopy next week, and see if we can get the achilles tendinosis to improve to help prevent recurrence.  Order written today.   Clerance Lav, DPM, FACFAS [facility]  [address] [place], [place] [address]                Office [phone]  Fax [phone]

## 2023-04-03 ENCOUNTER — Ambulatory Visit
Admission: RE | Admit: 2023-04-03 | Discharge: 2023-04-03 | Disposition: A | Discharge status: Home / Self Care | Payer: Medicare Other | Source: Ambulatory Visit | Attending: Podiatry | Admitting: Podiatry

## 2023-04-03 DIAGNOSIS — M7661 Achilles tendinitis, right leg: Secondary | ICD-10-CM

## 2023-04-06 ENCOUNTER — Ambulatory Visit: Payer: Medicare Other | Admitting: Podiatry

## 2023-04-06 DIAGNOSIS — M25471 Effusion, right ankle: Secondary | ICD-10-CM

## 2023-04-06 DIAGNOSIS — M7661 Achilles tendinitis, right leg: Secondary | ICD-10-CM | POA: Diagnosis not present

## 2023-04-06 DIAGNOSIS — R262 Difficulty in walking, not elsewhere classified: Secondary | ICD-10-CM

## 2023-04-06 NOTE — Progress Notes (Signed)
    Chief Complaint  Patient presents with   Foot Problem    Pt is here to review MRI results but they are not back yet    HPI: 77 y.o. female presents today to review her MRI results of the right ankle to evaluate her Achilles tendon.  She did have a previous rupture of the Achilles and is having similar pain in the area.  Denies weakness with walking.  It does appear that the MRI was performed but it has not yet been read by the radiologist.  No past medical history on file.  Past Surgical History:  Procedure Laterality Date   CESAREAN SECTION      Allergies  Allergen Reactions   Ibuprofen Shortness Of Breath   Physical Exam: There were no vitals filed for this visit.  On exam there is minimal edema to the posterior aspect of the ankle in the area of the proximal portion of the Achilles tendon.  Some palpable thinning of the Achilles tendon is noted approximately 5 cm to its insertion into the calcaneus.  There is pain on palpation of the area.  No nodules are noted.  Palpable pedal pulses.  MRI right ankle without contrast performed 04/03/2023:  We do not have the radiologist report at this time for definitive confirmation of the findings.  However, upon review of the MRI images with the patient today, there is marked thinning noted of the Achilles tendon 4 to 5 cm proximal to the calcaneal insertion with evidence of some interstitial edema noted in this area.  There is not a marked increased signal intensity of this area, possibly noting more chronic condition.  Assessment/Plan of Care: 1. Tendonitis, Achilles, right   2. Difficulty walking   3. Edema of right ankle     Discussed clinical findings with patient today.  Informed patient that the MRI is concerning for partial tear.  We will need to wait for radiologist confirmation and final read of the MRI.  In the meantime she was placed in a prefabricated pneumatic cam walker, to immobilize the ankle and provide compression  around the ankle.  No she will need to wear this at all times weightbearing.  She can remove for sleeping and bathing.  She will need to remove for driving or have someone else drive so that she can continue wearing the cam walker..  Will call the patient to review the final MRI report once it is read by the radiologist.  Will let her know if the current treatment plan has changed, if surgical intervention is needed, and schedule follow-up at that time.   Clerance Lav, DPM, FACFAS [facility]     [address] [place], [place] [address]                Office [phone]  Fax [phone]

## 2023-04-12 NOTE — Addendum Note (Signed)
Addended byLucia Estelle D on: 04/12/2023 12:40 PM   Modules accepted: Orders

## 2023-07-15 ENCOUNTER — Telehealth: Payer: Self-pay | Admitting: Neurology

## 2023-07-15 NOTE — Telephone Encounter (Signed)
LVM with [facility] @ Dr. Jenne Pane office asking for a call back to discuss coordinating an appt.

## 2023-07-15 NOTE — Telephone Encounter (Signed)
Pt calling to schedule a Botox appt for Dr. Jenne Pane to administer her injection.

## 2023-07-19 NOTE — Telephone Encounter (Addendum)
Melody Juarez- I called Davita @ Dr. Jenne Pane office again and we scheduled pt for 10/22 @ 12:00 pm.  [facility] auth: [id number] (07/19/23-07/18/24) buy/bill.

## 2023-07-26 NOTE — Progress Notes (Unsigned)
Botox- 100 units x 1 vial Lot: [id number] Expiration: 03.2027 NDC: 0023-3921-02 B/B  Bacteriostatic 0.9% Sodium Chloride- 2 mL  Lot: [id number] Expiration: 04.01.2025 NDC: [id number]  Dx: J38.3 Witnessed by Cinda Quest, CMA

## 2023-07-27 ENCOUNTER — Ambulatory Visit: Payer: Medicare Other | Admitting: Neurology

## 2023-07-27 ENCOUNTER — Encounter: Payer: Self-pay | Admitting: Neurology

## 2023-07-27 VITALS — BP 133/75 | HR 79 | Resp 14 | Ht 64.5 in | Wt 172.0 lb

## 2023-07-27 DIAGNOSIS — J383 Other diseases of vocal cords: Secondary | ICD-10-CM | POA: Diagnosis not present

## 2023-07-27 DIAGNOSIS — I10 Essential (primary) hypertension: Secondary | ICD-10-CM

## 2023-07-27 DIAGNOSIS — Z1211 Encounter for screening for malignant neoplasm of colon: Secondary | ICD-10-CM | POA: Insufficient documentation

## 2023-07-27 DIAGNOSIS — R490 Dysphonia: Secondary | ICD-10-CM | POA: Diagnosis not present

## 2023-07-27 HISTORY — DX: Essential (primary) hypertension: I10

## 2023-07-27 MED ORDER — ONABOTULINUMTOXINA 100 UNITS IJ SOLR
100.0000 [IU] | Freq: Once | INTRAMUSCULAR | Status: AC
  Administered 2023-07-27: 100 [IU] via INTRAMUSCULAR

## 2023-07-27 NOTE — Progress Notes (Signed)
Patient is injected by ENT physician Dr. Dwight Bates for spastic dysphonia,   Under EMG guidance, 2.5 units of Botox A was injected into left, and 2.5 unit to right thyroarytenoid muscle,  There was occasionally mild spontaneous activity, polyphasic morphology motor unit potential, with normal recruitment were noted at bilateral thyroarytenoid muscle with normal recruitment patterns  Patient tolerates the injection well, will return to clinic in 3-6 months for repeat injection 

## 2023-11-29 ENCOUNTER — Telehealth: Payer: Self-pay | Admitting: Neurology

## 2023-11-29 NOTE — Telephone Encounter (Signed)
 LVM with Davita @ Dr. Jenne Pane office asking her to call me back to schedule Botox appt.

## 2023-11-29 NOTE — Telephone Encounter (Signed)
 FYI Dr. Terrace Arabia- Delena Serve with Dr. Jenne Pane called me back, we were able to schedule for 3/24 @ 12:00.

## 2023-11-29 NOTE — Telephone Encounter (Signed)
 Pt asking to schedule a Botox appointment to include Dr. Jenne Pane

## 2023-12-07 ENCOUNTER — Other Ambulatory Visit: Payer: Self-pay | Admitting: Internal Medicine

## 2023-12-07 DIAGNOSIS — Z1231 Encounter for screening mammogram for malignant neoplasm of breast: Secondary | ICD-10-CM

## 2023-12-23 ENCOUNTER — Ambulatory Visit
Admission: RE | Admit: 2023-12-23 | Discharge: 2023-12-23 | Disposition: A | Discharge status: Home / Self Care | Source: Ambulatory Visit | Attending: Internal Medicine | Admitting: Internal Medicine

## 2023-12-23 DIAGNOSIS — Z1231 Encounter for screening mammogram for malignant neoplasm of breast: Secondary | ICD-10-CM

## 2023-12-27 ENCOUNTER — Ambulatory Visit: Payer: Medicare Other | Admitting: Neurology

## 2023-12-27 ENCOUNTER — Encounter: Payer: Self-pay | Admitting: Neurology

## 2023-12-27 VITALS — BP 146/78

## 2023-12-27 DIAGNOSIS — J383 Other diseases of vocal cords: Secondary | ICD-10-CM | POA: Diagnosis not present

## 2023-12-27 DIAGNOSIS — J385 Laryngeal spasm: Secondary | ICD-10-CM

## 2023-12-27 MED ORDER — ONABOTULINUMTOXINA 100 UNITS IJ SOLR
5.0000 [IU] | Freq: Once | INTRAMUSCULAR | Status: AC
  Administered 2023-12-27: 5 [IU] via INTRAMUSCULAR

## 2023-12-27 NOTE — Progress Notes (Signed)
Patient is injected by ENT physician Dr. Dwight Bates for spastic dysphonia,   Under EMG guidance, 2.5 units of Botox A was injected into left, and 2.5 unit to right thyroarytenoid muscle,  There was occasionally mild spontaneous activity, polyphasic morphology motor unit potential, with normal recruitment were noted at bilateral thyroarytenoid muscle with normal recruitment patterns  Patient tolerates the injection well, will return to clinic in 3-6 months for repeat injection 

## 2023-12-27 NOTE — Progress Notes (Signed)
 Botox- 100 units x 1 vials Lot: [id number] Expiration: 2027/05 NDC: 5409-8119-14  Bacteriostatic 0.9% Sodium Chloride- 2 mL  Lot: [id number] Expiration: 08/05/24 NDC: [id number]  Dx:  J38.3  B/B Witnessed by Aprilrn and Dr. Jenne Pane

## 2024-02-15 ENCOUNTER — Ambulatory Visit: Admitting: Podiatry

## 2024-05-31 NOTE — Telephone Encounter (Signed)
 Patient called in requesting Botox  appt. Coordinated with Davida @ Dr. Carlie office for 06/13/24 @ 12:00 pm.

## 2024-06-13 ENCOUNTER — Telehealth: Payer: Self-pay | Admitting: Neurology

## 2024-06-13 ENCOUNTER — Ambulatory Visit: Admitting: Neurology

## 2024-06-13 NOTE — Telephone Encounter (Signed)
 Melody Juarez with [facility] called me today at 11:21 am saying that Dr. Carlie called out today and would not be here for pt's 12:00 pm appointment. She stated she had not contacted the patient to tell her that her appointment was cancelled, but requested me to call. I called the pt and informed her that I was just now told Dr. Carlie would not be in, she rescheduled for 9/23 @ 12:00 pm. I also made sure this date would work with Melody Juarez.   Dr. Onita- this is just an FYI about her appointment day being cancelled.

## 2024-06-27 ENCOUNTER — Encounter: Payer: Self-pay | Admitting: Neurology

## 2024-06-27 ENCOUNTER — Ambulatory Visit: Admitting: Neurology

## 2024-06-27 VITALS — BP 134/71 | HR 82

## 2024-06-27 DIAGNOSIS — J383 Other diseases of vocal cords: Secondary | ICD-10-CM | POA: Diagnosis not present

## 2024-06-27 DIAGNOSIS — J385 Laryngeal spasm: Secondary | ICD-10-CM | POA: Diagnosis not present

## 2024-06-27 MED ORDER — ONABOTULINUMTOXINA 100 UNITS IJ SOLR: 2.0000 [IU] | Freq: Once | INTRAMUSCULAR | Status: DC

## 2024-06-27 NOTE — Progress Notes (Signed)
 Botox - 100 units x 2 vials Lot: [id number] Expiration: 2027/11 NDC: 9976-8854-98  Bacteriostatic 0.9% Sodium Chloride- 2 mL  Lot: [id number] Expiration: 08/04/25 NDC: [id number]  Dx: J38.3, J38.5  B/B Witnessed by DR. BATES DREW UP HIS OWN MEDICATION NO VERIFICATION NEEDED

## 2024-07-01 DIAGNOSIS — J383 Other diseases of vocal cords: Secondary | ICD-10-CM | POA: Diagnosis not present

## 2024-07-01 MED ORDER — ONABOTULINUMTOXINA 100 UNITS IJ SOLR
100.0000 [IU] | Freq: Once | INTRAMUSCULAR | Status: AC
  Administered 2024-07-01: 100 [IU] via INTRAMUSCULAR

## 2024-07-01 MED ORDER — ONABOTULINUMTOXINA 100 UNITS IJ SOLR: 100.0000 [IU] | Freq: Once | INTRAMUSCULAR | Status: DC

## 2024-07-01 NOTE — Progress Notes (Signed)
 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

## 2024-07-03 ENCOUNTER — Telehealth: Payer: Self-pay | Admitting: Neurology

## 2024-07-03 NOTE — Telephone Encounter (Signed)
 Call to patient, and spoke with Dr. Onita as well. Advised her to follow up with Dr. Carlie as he did the procedure and she is concerned as her voice is still trembling and is normally better after botox 

## 2024-07-03 NOTE — Telephone Encounter (Signed)
 Re: botulinum toxin Type A  (BOTOX ) injection 100 Units  pt states her voice still has it's tremble, normally after botox  she does not, pt is asking for a call from RN to discuss.

## 2024-07-05 NOTE — Telephone Encounter (Signed)
 Dr. Carlie office called and stated that Dr. Carlie is wanting the patient to come back in for more Botox . Informed Davida that I would have to see if Dr. Onita is ok with doing this with a Botox  sample, since insurance won't allow her to get more Botox  so soon. We tentatively scheduled her for 10/27 @ 12, but I told her I would let her know if we aren't able to do this.

## 2024-07-05 NOTE — Telephone Encounter (Signed)
 Ok to use botox  sample

## 2024-07-31 ENCOUNTER — Ambulatory Visit: Admitting: Neurology

## 2024-07-31 VITALS — BP 133/86 | HR 60 | Ht 64.0 in

## 2024-07-31 DIAGNOSIS — J383 Other diseases of vocal cords: Secondary | ICD-10-CM

## 2024-07-31 DIAGNOSIS — J385 Laryngeal spasm: Secondary | ICD-10-CM

## 2024-07-31 MED ORDER — ONABOTULINUMTOXINA 100 UNITS IJ SOLR: 5.0000 [IU] | Freq: Once | INTRAMUSCULAR | Status: AC

## 2024-07-31 NOTE — Progress Notes (Unsigned)
 Botox - 100 units x 1 vial Lot: [id number] Expiration: 2027/10 NDC: 9976-8854-98  Bacteriostatic 0.9% Sodium Chloride- 2 mL  Lot: [id number] Expiration: 08/04/25 NDC: [id number]  Dx: J38.3, J38.5   Typically buy and bill Witnessed by Dr. Carlie

## 2024-08-04 NOTE — Progress Notes (Signed)
 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  Under EMG guidance, 2.5 units of Botox  A was injected into left, and 2.5 unit to right thyroarytenoid muscle,  There was occasionally mild spontaneous activity, polyphasic morphology motor unit potential, with normal recruitment were noted at bilateral thyroarytenoid muscle with normal recruitment patterns  Patient tolerates the injection well, will return to clinic in  6 months for repeat injection
